# Patient Record
Sex: Female | Born: 1990 | Hispanic: Yes | Marital: Married | Smoking: Former smoker
Health system: Southern US, Community
[De-identification: ages and names within clinical notes are randomized; demographics above are authoritative.]

## PROBLEM LIST (undated history)

## (undated) DIAGNOSIS — F419 Anxiety disorder, unspecified: Secondary | ICD-10-CM

## (undated) DIAGNOSIS — S42309A Unspecified fracture of shaft of humerus, unspecified arm, initial encounter for closed fracture: Secondary | ICD-10-CM

## (undated) DIAGNOSIS — F32A Depression, unspecified: Secondary | ICD-10-CM

## (undated) DIAGNOSIS — K529 Noninfective gastroenteritis and colitis, unspecified: Secondary | ICD-10-CM

## (undated) DIAGNOSIS — K219 Gastro-esophageal reflux disease without esophagitis: Secondary | ICD-10-CM

## (undated) DIAGNOSIS — F329 Major depressive disorder, single episode, unspecified: Secondary | ICD-10-CM

## (undated) HISTORY — DX: Gastro-esophageal reflux disease without esophagitis: K21.9

## (undated) HISTORY — PX: APPENDECTOMY: SHX54

## (undated) HISTORY — DX: Anxiety disorder, unspecified: F41.9

## (undated) HISTORY — DX: Noninfective gastroenteritis and colitis, unspecified: K52.9

## (undated) HISTORY — DX: Unspecified fracture of shaft of humerus, unspecified arm, initial encounter for closed fracture: S42.309A

## (undated) HISTORY — DX: Depression, unspecified: F32.A

---
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
## 2016-02-20 ENCOUNTER — Emergency Department: Payer: Self-pay | Admitting: Anesthesiology

## 2016-02-20 ENCOUNTER — Encounter: Payer: Self-pay | Admitting: Emergency Medicine

## 2016-02-20 ENCOUNTER — Emergency Department: Payer: Self-pay

## 2016-02-20 ENCOUNTER — Observation Stay
Admission: EM | Admit: 2016-02-20 | Discharge: 2016-02-21 | Disposition: A | Discharge status: Home / Self Care | Payer: Self-pay | Attending: Surgery | Admitting: Surgery

## 2016-02-20 ENCOUNTER — Encounter
Admission: EM | Disposition: A | Discharge status: Home / Self Care | Payer: Self-pay | Source: Home / Self Care | Attending: Emergency Medicine

## 2016-02-20 DIAGNOSIS — Z87891 Personal history of nicotine dependence: Secondary | ICD-10-CM | POA: Insufficient documentation

## 2016-02-20 DIAGNOSIS — K353 Acute appendicitis with localized peritonitis: Principal | ICD-10-CM | POA: Insufficient documentation

## 2016-02-20 DIAGNOSIS — K3580 Unspecified acute appendicitis: Secondary | ICD-10-CM | POA: Diagnosis present

## 2016-02-20 HISTORY — PX: LAPAROSCOPIC APPENDECTOMY: SHX408

## 2016-02-20 LAB — URINALYSIS COMPLETE WITH MICROSCOPIC (ARMC ONLY)
BILIRUBIN URINE: NEGATIVE
Bacteria, UA: NONE SEEN
GLUCOSE, UA: NEGATIVE mg/dL
LEUKOCYTES UA: NEGATIVE
NITRITE: NEGATIVE
Protein, ur: NEGATIVE mg/dL
SPECIFIC GRAVITY, URINE: 1.02 (ref 1.005–1.030)
pH: 6 (ref 5.0–8.0)

## 2016-02-20 LAB — COMPREHENSIVE METABOLIC PANEL
ALT: 15 U/L (ref 14–54)
AST: 13 U/L — AB (ref 15–41)
Albumin: 4.2 g/dL (ref 3.5–5.0)
Alkaline Phosphatase: 69 U/L (ref 38–126)
Anion gap: 8 (ref 5–15)
BILIRUBIN TOTAL: 1.3 mg/dL — AB (ref 0.3–1.2)
BUN: 7 mg/dL (ref 6–20)
CALCIUM: 9 mg/dL (ref 8.9–10.3)
CO2: 25 mmol/L (ref 22–32)
CREATININE: 0.51 mg/dL (ref 0.44–1.00)
Chloride: 102 mmol/L (ref 101–111)
Glucose, Bld: 91 mg/dL (ref 65–99)
Potassium: 3.4 mmol/L — ABNORMAL LOW (ref 3.5–5.1)
Sodium: 135 mmol/L (ref 135–145)
TOTAL PROTEIN: 8 g/dL (ref 6.5–8.1)

## 2016-02-20 LAB — CBC
HCT: 40.3 % (ref 35.0–47.0)
Hemoglobin: 13.7 g/dL (ref 12.0–16.0)
MCH: 30.5 pg (ref 26.0–34.0)
MCHC: 34 g/dL (ref 32.0–36.0)
MCV: 89.9 fL (ref 80.0–100.0)
PLATELETS: 291 10*3/uL (ref 150–440)
RBC: 4.48 MIL/uL (ref 3.80–5.20)
RDW: 13.9 % (ref 11.5–14.5)
WBC: 14.4 10*3/uL — AB (ref 3.6–11.0)

## 2016-02-20 LAB — POCT PREGNANCY, URINE: Preg Test, Ur: NEGATIVE

## 2016-02-20 LAB — LIPASE, BLOOD: Lipase: 12 U/L (ref 11–51)

## 2016-02-20 SURGERY — APPENDECTOMY, LAPAROSCOPIC
Anesthesia: General

## 2016-02-20 MED ORDER — LACTATED RINGERS IV SOLN
INTRAVENOUS | Status: DC
  Administered 2016-02-21: 02:00:00 via INTRAVENOUS

## 2016-02-20 MED ORDER — IOPAMIDOL (ISOVUE-300) INJECTION 61%
100.0000 mL | Freq: Once | INTRAVENOUS | Status: AC | PRN
  Administered 2016-02-20: 100 mL via INTRAVENOUS
  Filled 2016-02-20: qty 100

## 2016-02-20 MED ORDER — SODIUM CHLORIDE 0.9 % IV BOLUS (SEPSIS)
1000.0000 mL | Freq: Once | INTRAVENOUS | Status: AC
  Administered 2016-02-20: 1000 mL via INTRAVENOUS

## 2016-02-20 MED ORDER — LIDOCAINE HCL (CARDIAC) 20 MG/ML IV SOLN
INTRAVENOUS | Status: DC | PRN
  Administered 2016-02-20: 80 mg via INTRAVENOUS

## 2016-02-20 MED ORDER — DEXAMETHASONE SODIUM PHOSPHATE 10 MG/ML IJ SOLN
INTRAMUSCULAR | Status: DC | PRN
  Administered 2016-02-20: 4 mg via INTRAVENOUS

## 2016-02-20 MED ORDER — OXYCODONE HCL 5 MG/5ML PO SOLN: 5.0000 mg | Freq: Once | ORAL | Status: DC | PRN

## 2016-02-20 MED ORDER — DEXTROSE 5 % IV SOLN
2.0000 g | Freq: Once | INTRAVENOUS | Status: AC
  Administered 2016-02-20: 2 g via INTRAVENOUS
  Filled 2016-02-20: qty 2

## 2016-02-20 MED ORDER — DIATRIZOATE MEGLUMINE & SODIUM 66-10 % PO SOLN
15.0000 mL | Freq: Once | ORAL | Status: AC
  Administered 2016-02-20: 15 mL via ORAL

## 2016-02-20 MED ORDER — PROPOFOL 10 MG/ML IV BOLUS
INTRAVENOUS | Status: DC | PRN
  Administered 2016-02-20: 150 mg via INTRAVENOUS

## 2016-02-20 MED ORDER — FENTANYL CITRATE (PF) 100 MCG/2ML IJ SOLN
INTRAMUSCULAR | Status: DC | PRN
  Administered 2016-02-20 (×2): 50 ug via INTRAVENOUS

## 2016-02-20 MED ORDER — OXYCODONE HCL 5 MG PO TABS: 5.0000 mg | ORAL_TABLET | Freq: Once | ORAL | Status: DC | PRN

## 2016-02-20 MED ORDER — FENTANYL CITRATE (PF) 100 MCG/2ML IJ SOLN
INTRAMUSCULAR | Status: AC
  Filled 2016-02-20: qty 2

## 2016-02-20 MED ORDER — SUCCINYLCHOLINE CHLORIDE 20 MG/ML IJ SOLN
INTRAMUSCULAR | Status: DC | PRN
  Administered 2016-02-20: 80 mg via INTRAVENOUS

## 2016-02-20 MED ORDER — MIDAZOLAM HCL 2 MG/2ML IJ SOLN
INTRAMUSCULAR | Status: DC | PRN
Start: 2016-02-20 — End: 2016-02-20
  Administered 2016-02-20: 2 mg via INTRAVENOUS

## 2016-02-20 MED ORDER — HYDROCODONE-ACETAMINOPHEN 5-325 MG PO TABS
1.0000 | ORAL_TABLET | ORAL | Status: DC | PRN
Start: 2016-02-20 — End: 2016-02-21
  Administered 2016-02-21: 2 via ORAL
  Filled 2016-02-20: qty 2

## 2016-02-20 MED ORDER — BUPIVACAINE-EPINEPHRINE (PF) 0.25% -1:200000 IJ SOLN
INTRAMUSCULAR | Status: DC | PRN
  Administered 2016-02-20: 30 mL via PERINEURAL

## 2016-02-20 MED ORDER — HEPARIN SODIUM (PORCINE) 5000 UNIT/ML IJ SOLN
5000.0000 [IU] | Freq: Three times a day (TID) | INTRAMUSCULAR | Status: DC
  Administered 2016-02-20: 5000 [IU] via SUBCUTANEOUS
  Filled 2016-02-20 (×2): qty 1

## 2016-02-20 MED ORDER — ONDANSETRON HCL 4 MG/2ML IJ SOLN
4.0000 mg | Freq: Four times a day (QID) | INTRAMUSCULAR | Status: DC | PRN
  Administered 2016-02-21: 4 mg via INTRAVENOUS
  Filled 2016-02-20: qty 2

## 2016-02-20 MED ORDER — ONDANSETRON HCL 4 MG/2ML IJ SOLN
INTRAMUSCULAR | Status: DC | PRN
  Administered 2016-02-20: 4 mg via INTRAVENOUS

## 2016-02-20 MED ORDER — HEPARIN SODIUM (PORCINE) 5000 UNIT/ML IJ SOLN: 5000.0000 [IU] | Freq: Three times a day (TID) | INTRAMUSCULAR | Status: DC

## 2016-02-20 MED ORDER — ONDANSETRON HCL 4 MG/2ML IJ SOLN
INTRAMUSCULAR | Status: AC
  Administered 2016-02-20: 4 mg
  Filled 2016-02-20: qty 2

## 2016-02-20 MED ORDER — LACTATED RINGERS IV SOLN
INTRAVENOUS | Status: DC | PRN
  Administered 2016-02-20: 22:00:00 via INTRAVENOUS

## 2016-02-20 MED ORDER — ONDANSETRON HCL 4 MG PO TABS: 4.0000 mg | ORAL_TABLET | Freq: Four times a day (QID) | ORAL | Status: DC | PRN

## 2016-02-20 MED ORDER — FENTANYL CITRATE (PF) 100 MCG/2ML IJ SOLN
25.0000 ug | INTRAMUSCULAR | Status: DC | PRN
Start: 2016-02-20 — End: 2016-02-21
  Administered 2016-02-20 (×4): 25 ug via INTRAVENOUS

## 2016-02-20 MED ORDER — MORPHINE SULFATE (PF) 2 MG/ML IV SOLN
2.0000 mg | INTRAVENOUS | Status: DC | PRN
  Administered 2016-02-21 (×2): 2 mg via INTRAVENOUS
  Filled 2016-02-20 (×2): qty 1

## 2016-02-20 MED ORDER — HYDROCODONE-ACETAMINOPHEN 5-325 MG PO TABS: 1.0000 | ORAL_TABLET | ORAL | Status: DC | PRN

## 2016-02-20 SURGICAL SUPPLY — 41 items
ADHESIVE MASTISOL STRL (MISCELLANEOUS) ×2 IMPLANT
APPLIER CLIP ROT 10 11.4 M/L (STAPLE) ×2
BLADE SURG SZ11 CARB STEEL (BLADE) ×2 IMPLANT
CANISTER SUCT 3000ML (MISCELLANEOUS) ×2 IMPLANT
CATH TRAY 16F METER LATEX (MISCELLANEOUS) ×2 IMPLANT
CHLORAPREP W/TINT 26ML (MISCELLANEOUS) ×2 IMPLANT
CLIP APPLIE ROT 10 11.4 M/L (STAPLE) ×1 IMPLANT
CUTTER FLEX LINEAR 45M (STAPLE) ×2 IMPLANT
DEVICE TROCAR PUNCTURE CLOSURE (ENDOMECHANICALS) ×2 IMPLANT
ELECT REM PT RETURN 9FT ADLT (ELECTROSURGICAL) ×2
ELECTRODE REM PT RTRN 9FT ADLT (ELECTROSURGICAL) ×1 IMPLANT
ENDOPOUCH RETRIEVER 10 (MISCELLANEOUS) ×2 IMPLANT
GAUZE SPONGE NON-WVN 2X2 STRL (MISCELLANEOUS) ×3 IMPLANT
GLOVE BIO SURGEON STRL SZ8 (GLOVE) ×8 IMPLANT
GOWN STRL REUS W/ TWL LRG LVL3 (GOWN DISPOSABLE) ×2 IMPLANT
GOWN STRL REUS W/TWL LRG LVL3 (GOWN DISPOSABLE) ×2
IRRIGATION STRYKERFLOW (MISCELLANEOUS) ×1 IMPLANT
IRRIGATOR STRYKERFLOW (MISCELLANEOUS) ×2
KIT RM TURNOVER STRD PROC AR (KITS) ×2 IMPLANT
LABEL OR SOLS (LABEL) IMPLANT
NDL SAFETY 22GX1.5 (NEEDLE) ×2 IMPLANT
NEEDLE VERESS 14GA 120MM (NEEDLE) ×2 IMPLANT
NS IRRIG 500ML POUR BTL (IV SOLUTION) ×2 IMPLANT
PACK LAP CHOLECYSTECTOMY (MISCELLANEOUS) ×2 IMPLANT
RELOAD 45 VASCULAR/THIN (ENDOMECHANICALS) ×2 IMPLANT
RELOAD STAPLE TA45 3.5 REG BLU (ENDOMECHANICALS) ×2 IMPLANT
SCISSORS METZENBAUM CVD 33 (INSTRUMENTS) ×2 IMPLANT
SLEEVE ENDOPATH XCEL 5M (ENDOMECHANICALS) ×2 IMPLANT
SOL .9 NS 3000ML IRR  AL (IV SOLUTION) ×1
SOL .9 NS 3000ML IRR UROMATIC (IV SOLUTION) ×1 IMPLANT
SPONGE LAP 18X18 5 PK (GAUZE/BANDAGES/DRESSINGS) ×2 IMPLANT
SPONGE VERSALON 2X2 STRL (MISCELLANEOUS) ×3
STRIP CLOSURE SKIN 1/2X4 (GAUZE/BANDAGES/DRESSINGS) ×2 IMPLANT
SUT MNCRL 4-0 (SUTURE) ×1
SUT MNCRL 4-0 27XMFL (SUTURE) ×1
SUT VICRYL 0 TIES 12 18 (SUTURE) ×2 IMPLANT
SUTURE MNCRL 4-0 27XMF (SUTURE) ×1 IMPLANT
TROCAR XCEL 12X100 BLDLESS (ENDOMECHANICALS) ×2 IMPLANT
TROCAR XCEL NON-BLD 5MMX100MML (ENDOMECHANICALS) ×2 IMPLANT
TUBING INSUFFLATOR HI FLOW (MISCELLANEOUS) ×2 IMPLANT
TUBING SCD EXTENSION 9995 GYN (TUBING) ×2 IMPLANT

## 2016-02-20 NOTE — ED Notes (Signed)
Patient transported to CT 

## 2016-02-20 NOTE — Anesthesia Preprocedure Evaluation (Signed)
Anesthesia Evaluation  Patient identified by MRN, date of birth, ID band Patient awake    Reviewed: Allergy & Precautions, H&P , NPO status , Patient's Chart, lab work & pertinent test results  Airway Mallampati: III  TM Distance: >3 FB Neck ROM: full    Dental  (+) Poor Dentition, Chipped   Pulmonary neg shortness of breath, former smoker,    Pulmonary exam normal breath sounds clear to auscultation       Cardiovascular Exercise Tolerance: Good (-) angina(-) Past MI and (-) DOE negative cardio ROS Normal cardiovascular exam Rhythm:regular Rate:Normal     Neuro/Psych negative neurological ROS  negative psych ROS   GI/Hepatic negative GI ROS, Neg liver ROS, neg GERD  ,  Endo/Other  negative endocrine ROS  Renal/GU negative Renal ROS  negative genitourinary   Musculoskeletal   Abdominal   Peds  Hematology negative hematology ROS (+)   Anesthesia Other Findings History reviewed. No pertinent past medical history.  History reviewed. No pertinent surgical history.  BMI    Body Mass Index   28.70 kg/m 2      Reproductive/Obstetrics negative OB ROS                             Anesthesia Physical Anesthesia Plan  ASA: II  Anesthesia Plan: General ETT   Post-op Pain Management:    Induction:   Airway Management Planned:   Additional Equipment:   Intra-op Plan:   Post-operative Plan:   Informed Consent: I have reviewed the patients History and Physical, chart, labs and discussed the procedure including the risks, benefits and alternatives for the proposed anesthesia with the patient or authorized representative who has indicated his/her understanding and acceptance.   Dental Advisory Given  Plan Discussed with: Anesthesiologist, CRNA and Surgeon  Anesthesia Plan Comments:         Anesthesia Quick Evaluation

## 2016-02-20 NOTE — Op Note (Signed)
laparascopic appendectomy   [place] Date of operation:  02/20/2016  Indications: The patient presented with a history of  abdominal pain. Workup has revealed findings consistent with acute appendicitis.  Pre-operative Diagnosis: Acute appendicitis  Post-operative Diagnosis: Acute appendicitis nonruptured  Surgeon: Jerrol Banana. Burt Knack, MD, FACS  Anesthesia: General with endotracheal tube  Procedure Details  The patient was seen again in the preop area. The options of surgery versus observation were reviewed with the patient and/or family. The risks of bleeding, infection, recurrence of symptoms, negative laparoscopy, potential for an open procedure, bowel injury, abscess or infection, were all reviewed as well. The patient was taken to Operating Room, identified as Alexandra Fernandez and the procedure verified as laparoscopic appendectomy. A Time Out was held and the above information confirmed.  The patient was placed in the supine position and general anesthesia was induced.  Antibiotic prophylaxis was administered and VT E prophylaxis was in place. A Foley catheter was placed by the nursing staff.   The abdomen was prepped and draped in a sterile fashion. An infraumbilical incision was made. A Veress needle was placed and pneumoperitoneum was obtained. A 5 mm trocar port was placed without difficulty and the abdominal cavity was explored.  Under direct vision a 5 mm suprapubic port was placed and a 13 mm left lateral port was placed all under direct vision.  The appendix was identified and found to be acutely inflamed . The appendix was carefully dissected. The base of the appendix was dissected out and divided with a standard load Endo GIA. The mesoappendix was divided with a vascular load Endo GIA. There was brisk arterial bleeding from the staple line and this was reinforced with clips which controlled the hemorrhage. The appendix was passed out through the left lateral port  site with the aid of an Endo Catch bag. The right lower quadrant and pelvis was then irrigated with copious amounts of normal saline which was aspirated. Inspection  failed to identify any additional bleeding and there were no signs of bowel injury. Therefore the left lateral port site was closed under direct vision utilizing an Endo Close technique with 0 Vicryl interrupted sutures, all under direct vision.   Again the right lower quadrant was inspected there was no sign of bleeding or bowel injury therefore pneumoperitoneum was released, all ports were removed and the skin incisions were approximated with subcuticular 4-0 Monocryl. Steri-Strips and Mastisol and sterile dressings were placed.  The patient tolerated the procedure well, there were no complications. The sponge lap and needle count were correct at the end of the procedure.  The patient was taken to the recovery room in stable condition to be admitted for continued care.  Findings: Acute appendicitis nonruptured  Estimated Blood Loss: 25 cc                  Specimens: appendix         Complications:  None                  [name] E. Burt Knack MD, FACS

## 2016-02-20 NOTE — Progress Notes (Signed)
Via an interpreter in the preop holding area I reviewed and discussed the options of observation versus surgery and the rationale for offering surgery I also discussed risk bleeding infection and conversion to an open procedure her English is well enough to where she understood even without the interpreter but her family members experienced the translator's expertise. Questions were answered and she agreed to proceed

## 2016-02-20 NOTE — Transfer of Care (Signed)
Immediate Anesthesia Transfer of Care Note  Patient: Alexandra Fernandez  Procedure(s) Performed: Procedure(s): APPENDECTOMY LAPAROSCOPIC (N/A)  Patient Location: PACU  Anesthesia Type:General  Level of Consciousness: awake, oriented and patient cooperative  Airway & Oxygen Therapy: Patient Spontanous Breathing  Post-op Assessment: Report given to RN and Post -op Vital signs reviewed and stable  Post vital signs: Reviewed and stable  Last Vitals:  Filed Vitals:   02/20/16 1838  BP: 107/59  Pulse: 89  Temp: 36.8 C  Resp: 18    Last Pain:  Filed Vitals:   02/20/16 1840  PainSc: 5          Complications: No apparent anesthesia complications

## 2016-02-20 NOTE — H&P (Signed)
Alexandra Fernandez is an 25 y.o. female.    Chief Complaint: Right lower quadrant pain  HPI: This Spanish-speaking patient who speaks enough English to give a history properly. A an interpreter is been requested and has not yet available but I'm able to obtain adequate answers and she understands our discussion.  She describes right lower quadrant pain in fact both lower quadrants starting early this morning she was fine yesterday and is never had an episode like this before she's had some nausea and vomiting normal bowel movement  A workup including a CT scan was relayed to me and is been personally reviewed suggesting acute appendicitis  History reviewed. No pertinent past medical history.  History reviewed. No pertinent past surgical history.  No family history on file. Social History:  reports that she has quit smoking. She does not have any smokeless tobacco history on file. Her alcohol and drug histories are not on file.  Allergies: No Known Allergies   (Not in a hospital admission)   Review of Systems  Constitutional: Negative for fever and chills.  HENT: Negative.   Eyes: Negative.   Respiratory: Negative.   Cardiovascular: Negative.   Gastrointestinal: Positive for nausea, vomiting and abdominal pain. Negative for heartburn and diarrhea.  Genitourinary: Negative.   Musculoskeletal: Negative.   Skin: Negative.   Neurological: Negative.   Endo/Heme/Allergies: Negative.   Psychiatric/Behavioral: Negative.      Physical Exam:  BP 107/59 mmHg  Pulse 89  Temp(Src) 98.3 F (36.8 C) (Oral)  Resp 18  Ht 5' 3" (1.6 m)  Wt 162 lb (73.483 kg)  BMI 28.70 kg/m2  SpO2 100%  LMP 02/01/2016 (Exact Date)  Physical Exam  Constitutional: She is oriented to person, place, and time and well-developed, well-nourished, and in no distress. No distress.  Appears comfortable in no acute distress  HENT:  Head: Normocephalic and atraumatic.  Eyes: Pupils are equal, round,  and reactive to light. Right eye exhibits no discharge. Left eye exhibits no discharge. No scleral icterus.  Neck: Normal range of motion.  Cardiovascular: Normal rate, regular rhythm and normal heart sounds.   Pulmonary/Chest: Effort normal and breath sounds normal. No respiratory distress. She has no wheezes. She has no rales.  Abdominal: Soft. She exhibits no distension. There is tenderness. There is rebound and guarding.  Maximal tenderness in the right lower quadrant at McBurney's point with a positive Rovsing sign  Musculoskeletal: Normal range of motion. She exhibits no edema.  Lymphadenopathy:    She has no cervical adenopathy.  Neurological: She is alert and oriented to person, place, and time.  Skin: Skin is warm and dry. No rash noted. She is not diaphoretic. No erythema.  Psychiatric: Mood and affect normal.  Vitals reviewed.       Results for orders placed or performed during the hospital encounter of 02/20/16 (from the past 48 hour(s))  Lipase, blood     Status: None   Collection Time: 02/20/16  6:43 PM  Result Value Ref Range   Lipase 12 11 - 51 U/L  Comprehensive metabolic panel     Status: Abnormal   Collection Time: 02/20/16  6:43 PM  Result Value Ref Range   Sodium 135 135 - 145 mmol/L   Potassium 3.4 (L) 3.5 - 5.1 mmol/L   Chloride 102 101 - 111 mmol/L   CO2 25 22 - 32 mmol/L   Glucose, Bld 91 65 - 99 mg/dL   BUN 7 6 - 20 mg/dL   Creatinine, Ser   0.51 0.44 - 1.00 mg/dL   Calcium 9.0 8.9 - 10.3 mg/dL   Total Protein 8.0 6.5 - 8.1 g/dL   Albumin 4.2 3.5 - 5.0 g/dL   AST 13 (L) 15 - 41 U/L   ALT 15 14 - 54 U/L   Alkaline Phosphatase 69 38 - 126 U/L   Total Bilirubin 1.3 (H) 0.3 - 1.2 mg/dL   GFR calc non Af Amer >60 >60 mL/min   GFR calc Af Amer >60 >60 mL/min    Comment: (NOTE) The eGFR has been calculated using the CKD EPI equation. This calculation has not been validated in all clinical situations. eGFR's persistently <60 mL/min signify possible  Chronic Kidney Disease.    Anion gap 8 5 - 15  CBC     Status: Abnormal   Collection Time: 02/20/16  6:43 PM  Result Value Ref Range   WBC 14.4 (H) 3.6 - 11.0 K/uL   RBC 4.48 3.80 - 5.20 MIL/uL   Hemoglobin 13.7 12.0 - 16.0 g/dL   HCT 40.3 35.0 - 47.0 %   MCV 89.9 80.0 - 100.0 fL   MCH 30.5 26.0 - 34.0 pg   MCHC 34.0 32.0 - 36.0 g/dL   RDW 13.9 11.5 - 14.5 %   Platelets 291 150 - 440 K/uL  Urinalysis complete, with microscopic     Status: Abnormal   Collection Time: 02/20/16  6:44 PM  Result Value Ref Range   Color, Urine YELLOW (A) YELLOW   APPearance CLEAR (A) CLEAR   Glucose, UA NEGATIVE NEGATIVE mg/dL   Bilirubin Urine NEGATIVE NEGATIVE   Ketones, ur 1+ (A) NEGATIVE mg/dL   Specific Gravity, Urine 1.020 1.005 - 1.030   Hgb urine dipstick 1+ (A) NEGATIVE   pH 6.0 5.0 - 8.0   Protein, ur NEGATIVE NEGATIVE mg/dL   Nitrite NEGATIVE NEGATIVE   Leukocytes, UA NEGATIVE NEGATIVE   RBC / HPF 0-5 0 - 5 RBC/hpf   WBC, UA 0-5 0 - 5 WBC/hpf   Bacteria, UA NONE SEEN NONE SEEN   Squamous Epithelial / LPF 6-30 (A) NONE SEEN   Mucous PRESENT   Pregnancy, urine POC     Status: None   Collection Time: 02/20/16  6:52 PM  Result Value Ref Range   Preg Test, Ur NEGATIVE NEGATIVE    Comment:        THE SENSITIVITY OF THIS METHODOLOGY IS >24 mIU/mL    Ct Abdomen Pelvis W Contrast  02/20/2016  CLINICAL DATA:  Acute right lower quadrant pain with nausea, vomiting and fever. Elevated white count. EXAM: CT ABDOMEN AND PELVIS WITH CONTRAST TECHNIQUE: Multidetector CT imaging of the abdomen and pelvis was performed using the standard protocol following bolus administration of intravenous contrast. CONTRAST:  122m ISOVUE-300 IOPAMIDOL (ISOVUE-300) INJECTION 61% COMPARISON:  None. FINDINGS: Lower chest:  No acute findings. Hepatobiliary: No masses or other significant abnormality. Pancreas: No mass, inflammatory changes, or other significant abnormality. Spleen: Within normal limits in size and  appearance. Adrenals/Urinary Tract: Normal adrenal glands. No renal obstruction or hydronephrosis. No obstructing ureteral calculus on either side. Right kidney demonstrates a 10 mm cortical cyst image 32 in the lower pole. Left kidney demonstrates a upper pole punctate nonobstructing caliceal calculus, image 27. Stomach/Bowel: Negative for bowel obstruction, significant dilatation, ileus, or free air. Appendix is visualized with a thickened edematous wall and mucosal enhancement. Appendix diameter is 12 mm as it extends over the left iliac vessels into the pelvis, image 64. Appearance is compatible with early  non ruptured acute appendicitis. No abscess or fluid collection. Vascular/Lymphatic: Small mildly enlarged right lower quadrant mesenteric lymph nodes noted, images 50 through 53. Intact aorta. No acute vascular process. Reproductive: Uterus and adnexal normal in size. Several ovarian follicles present bilaterally. No pelvic free fluid. Other: No inguinal abnormality or hernia.  Intact abdominal wall. Musculoskeletal:  No acute osseous finding. IMPRESSION: Abnormal thick-walled, edematous and dilated appendix measuring 12 mm compatible with acute early nonruptured appendicitis. These results were called by telephone at the time of interpretation on 02/20/2016 at 9:06 pm to Dr. [name] , who verbally acknowledged these results. Electronically Signed   By: Jerilynn Mages.  Shick M.D.   On: 02/20/2016 21:08     Assessment/Plan  Impression is acute appendicitis I have recommended laparoscopic appendectomy I discussed with she and her family the rationale for surgery the options of observation the risks of bleeding infection and recurrent disease she understood and agreed to proceed she is Spanish-speaking but understands enough English to voice understanding. I did tell her that we would review all of this once an interpreter was available as there was none available in the emergency room at the time of our visit.  Attestation will be performed at that time.  Florene Glen, MD, FACS

## 2016-02-20 NOTE — ED Provider Notes (Signed)
[facility] Emergency Department Provider Note    ____________________________________________  Time seen: ~2005  I have reviewed the triage vital signs and the nursing notes.   HISTORY  Chief Complaint Abdominal Pain   History limited by: Not Limited   HPI Alexandra Fernandez is a 25 y.o. female who presents to the emergency department today because of concerns for abdominal pain. The pain woke her up at 2 AM this morning. At that time the pain was diffusely throughout her abdomen. She did feel like she had a fever as well. She took some Tylenol which she states helped with the fever but did not help with the pain. Since then the pain is migrated into the right side and right lower quadrant. She has not had an appetite today. She has vomited a couple times. She denies any diarrhea or bloody stool. Denies similar pain in the past.   History reviewed. No pertinent past medical history.  There are no active problems to display for this patient.   History reviewed. No pertinent past surgical history.  No current outpatient prescriptions on file.  Allergies Review of patient's allergies indicates no known allergies.  No family history on file.  Social History Social History  Substance Use Topics  . Smoking status: Former Research scientist (life sciences)  . Smokeless tobacco: None  . Alcohol Use: None    Review of Systems  Constitutional: Negative for fever. Cardiovascular: Negative for chest pain. Respiratory: Negative for shortness of breath. Gastrointestinal: Positive for abdominal pain, nausea and vomiting Genitourinary: Negative for dysuria. Musculoskeletal: Negative for back pain. Skin: Negative for rash. Neurological: Negative for headaches, focal weakness or numbness.  10-point ROS otherwise negative.  ____________________________________________   PHYSICAL EXAM:  VITAL SIGNS: ED Triage Vitals  Enc Vitals Group     BP 02/20/16 1838 107/59 mmHg   Pulse Rate 02/20/16 1838 89     Resp 02/20/16 1838 18     Temp 02/20/16 1838 98.3 F (36.8 C)     Temp Source 02/20/16 1838 Oral     SpO2 02/20/16 1838 100 %     Weight 02/20/16 1838 162 lb (73.483 kg)     Height 02/20/16 1838 5\' 3"  (1.6 m)     Head Cir --      Peak Flow --      Pain Score 02/20/16 1839 5   Constitutional: Alert and oriented. Well appearing and in no distress. Eyes: Conjunctivae are normal. PERRL. Normal extraocular movements. ENT   Head: Normocephalic and atraumatic.   Nose: No congestion/rhinnorhea.   Mouth/Throat: Mucous membranes are moist.   Neck: No stridor. Hematological/Lymphatic/Immunilogical: No cervical lymphadenopathy. Cardiovascular: Normal rate, regular rhythm.  No murmurs, rubs, or gallops. Respiratory: Normal respiratory effort without tachypnea nor retractions. Breath sounds are clear and equal bilaterally. No wheezes/rales/rhonchi. Gastrointestinal: Soft. Tender to palpation in the right lower quadrant without any rebound or guarding. Negative Rovsing's sign. No distention. Genitourinary: Deferred Musculoskeletal: Normal range of motion in all extremities. No joint effusions.  No lower extremity tenderness nor edema. Neurologic:  Normal speech and language. No gross focal neurologic deficits are appreciated.  Skin:  Skin is warm, dry and intact. No rash noted. Psychiatric: Mood and affect are normal. Speech and behavior are normal. Patient exhibits appropriate insight and judgment.  ____________________________________________    LABS (pertinent positives/negatives)  Labs Reviewed  COMPREHENSIVE METABOLIC PANEL - Abnormal; Notable for the following:    Potassium 3.4 (*)    AST 13 (*)    Total Bilirubin  1.3 (*)    All other components within normal limits  CBC - Abnormal; Notable for the following:    WBC 14.4 (*)    All other components within normal limits  URINALYSIS COMPLETEWITH MICROSCOPIC ([facility] ONLY) - Abnormal; Notable  for the following:    Color, Urine YELLOW (*)    APPearance CLEAR (*)    Ketones, ur 1+ (*)    Hgb urine dipstick 1+ (*)    Squamous Epithelial / LPF 6-30 (*)    All other components within normal limits  LIPASE, BLOOD  POC URINE PREG, ED  POCT PREGNANCY, URINE     ____________________________________________   EKG  None  ____________________________________________    RADIOLOGY  CT abd/pel  IMPRESSION: Abnormal thick-walled, edematous and dilated appendix measuring 12 mm compatible with acute early nonruptured appendicitis.  I, Alexandra Fernandez, personally discussed these images and results by phone with the on-call radiologist and used this discussion as part of my medical decision making.   ____________________________________________   PROCEDURES  Procedure(s) performed: None  Critical Care performed: No  ____________________________________________   INITIAL IMPRESSION / ASSESSMENT AND PLAN / ED COURSE  Pertinent labs & imaging results that were available during my care of the patient were reviewed by me and considered in my medical decision making (see chart for details).  Patient presented to the emergency department today with concerns for abdominal pain. On exam she is tender in the right lower quadrant and does have leukocytosis on blood work. I do have concern for appendicitis. Will get a CT scan.   CT scan was consistent with acute appendicitis. Dr. Burt Knack with surgery was consulted and he will evaluate the patient. ____________________________________________   FINAL CLINICAL IMPRESSION(S) / ED DIAGNOSES  Final diagnoses:  Acute appendicitis, unspecified acute appendicitis type     Note: This dictation was prepared with Dragon dictation. Any transcriptional errors that result from this process are unintentional    Alexandra Pear, MD 02/20/16 2131

## 2016-02-20 NOTE — Discharge Instructions (Signed)
Remove dressing in 24 hours. °May shower in 24 hours. °Leave paper strips in place. °Resume all home medications. °Follow-up with Dr. Marysa Wessner in 10 days. °

## 2016-02-20 NOTE — ED Notes (Signed)
Patient to ER from [facility] for c/o abdominal pain. Patient reports pain to RLQ and mid abdomen since 0200 this am. +Nausea, vomiting x2. Denies diarrhea. Believes she had fever at 0200, took Tylenol and fever resolved.

## 2016-02-20 NOTE — Anesthesia Procedure Notes (Signed)
Procedure Name: Intubation Date/Time: 02/20/2016 10:24 PM Performed by: Lendon Colonel Pre-anesthesia Checklist: Emergency Drugs available, Patient identified, Suction available, Patient being monitored and Timeout performed Patient Re-evaluated:Patient Re-evaluated prior to inductionOxygen Delivery Method: Circle system utilized Preoxygenation: Pre-oxygenation with 100% oxygen Intubation Type: IV induction and Rapid sequence Laryngoscope Size: Miller and 2 Grade View: Grade I Tube type: Oral Number of attempts: 1 Airway Equipment and Method: Stylet Placement Confirmation: ETT inserted through vocal cords under direct vision,  positive ETCO2 and breath sounds checked- equal and bilateral Secured at: 18 cm Tube secured with: Tape Dental Injury: Teeth and Oropharynx as per pre-operative assessment

## 2016-02-21 ENCOUNTER — Encounter: Payer: Self-pay | Admitting: Surgery

## 2016-02-21 NOTE — Care Management (Signed)
Patient admitted status post lap appy.  Patient provided with application for [facility] and Medication Management.  Vicodin is the only rx at time of discharge.  Coupon provided for patient from [facility]. At [facility] per patient request.  Out of pocket cost. $12.30

## 2016-02-21 NOTE — Anesthesia Postprocedure Evaluation (Signed)
Anesthesia Post Note  Patient: Alexandra Fernandez  Procedure(s) Performed: Procedure(s) (LRB): APPENDECTOMY LAPAROSCOPIC (N/A)  Patient location during evaluation: PACU Anesthesia Type: General Level of consciousness: awake and alert Pain management: pain level controlled Vital Signs Assessment: post-procedure vital signs reviewed and stable Respiratory status: spontaneous breathing, nonlabored ventilation, respiratory function stable and patient connected to nasal cannula oxygen Cardiovascular status: blood pressure returned to baseline and stable Postop Assessment: no signs of nausea or vomiting Anesthetic complications: no    Last Vitals:  Filed Vitals:   02/21/16 0148 02/21/16 0535  BP: 117/66 101/60  Pulse: 86 96  Temp: 36.6 C 36.7 C  Resp: 18 20    Last Pain:  Filed Vitals:   02/21/16 0611  PainSc: 8                  Precious Haws Piscitello

## 2016-02-21 NOTE — Progress Notes (Signed)
02/21/2016 14:00  Alexandra Fernandez to be D/C'd Home per MD order.  Discussed prescriptions and follow up appointments with the patient. Prescriptions given to patient, medication list explained in detail. Pt verbalized understanding.    Medication List    TAKE these medications        HYDROcodone-acetaminophen 5-325 MG tablet  Commonly known as:  NORCO/VICODIN  Take 1-2 tablets by mouth every 4 (four) hours as needed for moderate pain.        Filed Vitals:   02/21/16 0535 02/21/16 0928  BP: 101/60 114/67  Pulse: 96 91  Temp: 98.1 F (36.7 C) 98 F (36.7 C)  Resp: 20 18    Skin clean, dry and intact without evidence of skin break down, no evidence of skin tears noted. IV catheter discontinued intact. Site without signs and symptoms of complications. Dressing and pressure applied. Pt denies pain at this time. No complaints noted.  An After Visit Summary was printed and given to the patient. Patient escorted via [place], and D/C home via private auto.  Dola Argyle

## 2016-02-21 NOTE — Discharge Summary (Signed)
  Patient ID: Monna Shiels MRN: [id number] DOB/AGE: 1990-09-28 25 y.o.  Admit date: 02/20/2016 Discharge date: 02/21/2016   Discharge Diagnoses:  Active Problems:   Acute appendicitis   Procedures:lap appendectomy  Hospital Course: 25 year old female admitted with classic signs and symptoms of acute appendicitis confirmed by CT scan. Dr. Burt Knack perform a laparoscopic appendectomy and this was uneventful. She was kept overnight. At time of discharge she was ambulating, she was tolerating regular diet she was afebrile and her vital signs were stable. Her physical exam she was in no acute distress awake alert, abdomen was soft incisions with dressings in place no evidence of infection And with significant clinical improvement. Condition at the time of discharge is stable   Disposition: Final discharge disposition not confirmed  Discharge Instructions    Call MD for:  difficulty breathing, headache or visual disturbances    Complete by:  As directed      Call MD for:  extreme fatigue    Complete by:  As directed      Call MD for:  hives    Complete by:  As directed      Call MD for:  persistant dizziness or light-headedness    Complete by:  As directed      Call MD for:  persistant nausea and vomiting    Complete by:  As directed      Call MD for:  redness, tenderness, or signs of infection (pain, swelling, redness, odor or green/yellow discharge around incision site)    Complete by:  As directed      Call MD for:  severe uncontrolled pain    Complete by:  As directed      Call MD for:  temperature >100.4    Complete by:  As directed      Diet - low sodium heart healthy    Complete by:  As directed      Increase activity slowly    Complete by:  As directed      Lifting restrictions    Complete by:  As directed   20 lbs            Medication List    TAKE these medications        HYDROcodone-acetaminophen 5-325 MG tablet  Commonly known as:  NORCO/VICODIN  Take  1-2 tablets by mouth every 4 (four) hours as needed for moderate pain.           Follow-up Information    Follow up with Phoebe Perch, MD On 02/29/2016.   Specialty:  Surgery   Why:  @9 :30am  For wound re-check   Contact information:   [address] [place] [address] [phone]        Caroleen Hamman, MD FACS

## 2016-02-22 LAB — SURGICAL PATHOLOGY

## 2016-02-26 ENCOUNTER — Encounter: Payer: Self-pay | Admitting: Surgery

## 2016-02-27 ENCOUNTER — Telehealth: Payer: Self-pay

## 2016-02-27 NOTE — Telephone Encounter (Signed)
Patient's FMLA was filled out and faxed to [employer]. Fax # [phone] Phone# [phone]

## 2016-02-29 ENCOUNTER — Ambulatory Visit (INDEPENDENT_AMBULATORY_CARE_PROVIDER_SITE_OTHER): Payer: Self-pay | Admitting: Surgery

## 2016-02-29 ENCOUNTER — Ambulatory Visit: Payer: Self-pay | Admitting: Surgery

## 2016-02-29 VITALS — BP 116/68 | HR 59 | Temp 99.0°F | Ht 63.0 in | Wt 161.0 lb

## 2016-02-29 DIAGNOSIS — K3580 Unspecified acute appendicitis: Secondary | ICD-10-CM

## 2016-02-29 MED ORDER — HYDROCODONE-ACETAMINOPHEN 5-300 MG PO TABS: 1.0000 | ORAL_TABLET | ORAL | 0 refills | Status: DC | PRN

## 2016-02-29 MED ORDER — HYDROCODONE-ACETAMINOPHEN 10-325 MG PO TABS: 1.0000 | ORAL_TABLET | Freq: Four times a day (QID) | ORAL | 0 refills | Status: DC | PRN

## 2016-02-29 NOTE — Progress Notes (Signed)
Outpatient postop visit  02/29/2016  Alexandra Fernandez is an 25 y.o. female.    Procedure: Lap or scopic appendectomy  [id number]  HPI: feels well but has left lower quadrant incisional pain he is concerned about going to work around [place] taking narcotics. But she asked for refill of her Vicodin. Eating well.  Medications reviewed.    Physical Exam:  LMP 02/01/2016 (Exact Date) Comment: neg preg test    PE: Abdomen is soft nondistended nontympanitic and nontender wounds are clean without erythema or drainage    Assessment/Plan:  Pathology is reviewed. Patient doing well but having pain around her 13 mm incision in the left lower quadrant as expected. I will refill her Vicodin and asked her to follow up on an as-needed basis she can return to work in 2 weeks  Florene Glen, MD, FACS

## 2016-02-29 NOTE — Patient Instructions (Signed)
Please call our office if you have any questions.

## 2016-03-04 NOTE — Telephone Encounter (Signed)
error 

## 2016-03-06 ENCOUNTER — Telehealth: Payer: Self-pay | Admitting: Surgery

## 2016-03-06 NOTE — Telephone Encounter (Signed)
Starting this morning at 8:00 AM patient stated that she was woken up with a sharp and pulsating pain on her Lower Right Abdomen. She stated that the pain last a good five minutes before it diminished. Then she went to take a shower and her soap had fallen off her hand and when she went bent down to grab it, the sharp pain started again and last about five minutes again. She then took a Hydrocodone that she had left from her surgery that was done on 02/20/2016. Four hours later, patient took a Motrin to ease her pain but neither the Hydrocodone nor the Motrin has helped with the pain. Since her second episode, the pain has not gone away. She stated that this was the first time that this had happened. Patient also stated that now its a constant burning sensation on her largest incision area, warm at touch but patient stated that she does not have a fever. Patient denies nausea, vomiting, constipation, or diarrhea. I told patient that I would have to call her after I speak with the doctor on how we need to handle her case. I also told her that as soon as the doctor tells me what to do, then I will call her back. Patient understood. I asked patient to give me another number:[phone].

## 2016-03-06 NOTE — Telephone Encounter (Signed)
Patient is calling again requesting to speak with a nurse. She is in a lot of pain. States the medication she was given is not helping with her pain. She has laparoscopic appendectomy on 7/18 with Dr Burt Knack. Please call and advise.

## 2016-03-06 NOTE — Telephone Encounter (Signed)
Patient had her appendix removed 7/18 and the incision is fine but she states she is having pain on the inside. She would rather speak Spanish so if Herb Grays could please call her.

## 2016-03-06 NOTE — Telephone Encounter (Signed)
Returned patients call and was not able to leave her a voicemail since it's not set-up. Will call her later on.

## 2016-03-07 ENCOUNTER — Encounter: Payer: Self-pay | Admitting: Surgery

## 2016-03-07 ENCOUNTER — Ambulatory Visit (INDEPENDENT_AMBULATORY_CARE_PROVIDER_SITE_OTHER): Payer: Self-pay | Admitting: Surgery

## 2016-03-07 VITALS — BP 108/63 | HR 62 | Temp 98.3°F | Ht 63.0 in | Wt 161.6 lb

## 2016-03-07 DIAGNOSIS — K353 Acute appendicitis with localized peritonitis, without perforation or gangrene: Secondary | ICD-10-CM

## 2016-03-07 MED ORDER — CYCLOBENZAPRINE HCL 5 MG PO TABS: 5.0000 mg | ORAL_TABLET | Freq: Three times a day (TID) | ORAL | Status: DC | PRN

## 2016-03-07 NOTE — Patient Instructions (Signed)
Please call our office if you have any questions or concerns.  

## 2016-03-07 NOTE — Telephone Encounter (Signed)
Called patient back and told her that we will need to see her today at 2:30 PM. Patient agreed.

## 2016-03-07 NOTE — Progress Notes (Signed)
25yr old female status post laparoscopic appendectomy on July 18 with Dr. Burt Knack. The patient called in with complaints in her left lower quadrant nodes a stabbing incision pain. The patient states that she has been eating and drinking well and having bowel movements without any issue. Patient also states that the pain that she's been having just stays right at that incision site. The patient states that sometimes that the burning pain but the stabbing bad pain happens usually after picking up and something from the floor or when she first gets up and sits up in the morning.   Vitals:   03/07/16 1446  BP: 108/63  Pulse: 62  Temp: 98.3 F (36.8 C)   PE:  Gen: NAD Abd: soft, appropriately tender, incisions c/d/i healing well  A/P:  Patient seems to be healing well after laparoscopic appendectomy. The patient seems to be having normal pain and some muscle spasms around her left lower quadrant incision especially with sudden movements. This with the patient this is normal. The patient has no evidence of infection and no evidence of herniation in this area. Discussed that these are likely normal postoperative symptoms. I did discuss with the patient that she were to get fever chills nausea or vomiting that she should call our office. I did give her a prescription for some Flexeril to help with the muscle spasms

## 2016-03-08 ENCOUNTER — Encounter: Payer: Self-pay | Admitting: Surgery

## 2017-02-25 ENCOUNTER — Ambulatory Visit: Payer: BLUE CROSS/BLUE SHIELD | Admitting: Obstetrics and Gynecology

## 2017-02-25 VITALS — BP 107/61 | HR 65 | Ht 65.0 in | Wt 175.9 lb

## 2017-02-25 DIAGNOSIS — N926 Irregular menstruation, unspecified: Secondary | ICD-10-CM

## 2017-02-25 NOTE — Progress Notes (Signed)
Alexandra Fernandez presents for NOB nurse interview visit. Pregnancy confirmation done here at [facility].  G- 1.  P- . Pregnancy education material explained and given. _No cats in the home. NOB labs ordered.Marland Kitchen HIV labs and Drug screen were explained optional and she did not decline. Drug screen ordered PNV encouraged. Genetic screening options discussed. Genetic testing:/Unsure.  Pt may discuss with provider. Pt. To follow up with provider in _3_ weeks for NOB physical.  All questions answered.

## 2017-02-26 LAB — CBC WITH DIFFERENTIAL/PLATELET
Basophils Absolute: 0 10*3/uL (ref 0.0–0.2)
Basos: 0 %
EOS (ABSOLUTE): 0.1 10*3/uL (ref 0.0–0.4)
Eos: 1 %
Hematocrit: 38.5 % (ref 34.0–46.6)
Hemoglobin: 12.5 g/dL (ref 11.1–15.9)
Immature Grans (Abs): 0 10*3/uL (ref 0.0–0.1)
Immature Granulocytes: 0 %
Lymphocytes Absolute: 2.2 10*3/uL (ref 0.7–3.1)
Lymphs: 30 %
MCH: 29.6 pg (ref 26.6–33.0)
MCHC: 32.5 g/dL (ref 31.5–35.7)
MCV: 91 fL (ref 79–97)
Monocytes Absolute: 0.5 10*3/uL (ref 0.1–0.9)
Monocytes: 7 %
Neutrophils Absolute: 4.6 10*3/uL (ref 1.4–7.0)
Neutrophils: 62 %
Platelets: 334 10*3/uL (ref 150–379)
RBC: 4.23 x10E6/uL (ref 3.77–5.28)
RDW: 14.6 % (ref 12.3–15.4)
WBC: 7.4 10*3/uL (ref 3.4–10.8)

## 2017-02-26 LAB — RUBELLA SCREEN: RUBELLA: 9.71 {index} (ref >0.99)

## 2017-02-26 LAB — RPR: RPR Ser Ql: NONREACTIVE

## 2017-02-26 LAB — VARICELLA ZOSTER ANTIBODY, IGG: VARICELLA: 1183 {index} (ref >165)

## 2017-02-26 LAB — ANTIBODY SCREEN: ANTIBODY SCREEN: NEGATIVE

## 2017-02-26 LAB — HEPATITIS B SURFACE ANTIGEN: HEP B S AG: NEGATIVE

## 2017-02-26 LAB — ABO AND RH: Rh Factor: POSITIVE

## 2017-02-26 LAB — HIV ANTIBODY (ROUTINE TESTING W REFLEX): HIV Screen 4th Generation wRfx: NONREACTIVE

## 2017-02-27 LAB — URINE CULTURE

## 2017-02-28 LAB — MONITOR DRUG PROFILE 14(MW)
Amphetamine Scrn, Ur: NEGATIVE ng/mL
BARBITURATE SCREEN URINE: NEGATIVE ng/mL
BENZODIAZEPINE SCREEN, URINE: NEGATIVE ng/mL
BUPRENORPHINE, URINE: NEGATIVE ng/mL
CANNABINOIDS UR QL SCN: NEGATIVE ng/mL
CREATININE(CRT), U: 85.6 mg/dL (ref 20.0–300.0)
Cocaine (Metab) Scrn, Ur: NEGATIVE ng/mL
FENTANYL, URINE: NEGATIVE pg/mL
METHADONE SCREEN, URINE: NEGATIVE ng/mL
Meperidine Screen, Urine: NEGATIVE ng/mL
OPIATE SCREEN URINE: NEGATIVE ng/mL
OXYCODONE+OXYMORPHONE UR QL SCN: NEGATIVE ng/mL
PHENCYCLIDINE QUANTITATIVE URINE: NEGATIVE ng/mL
Ph of Urine: 7.6 (ref 4.5–8.9)
Propoxyphene Scrn, Ur: NEGATIVE ng/mL
SPECIFIC GRAVITY: 1.012
TRAMADOL SCREEN, URINE: NEGATIVE ng/mL

## 2017-02-28 LAB — URINALYSIS, ROUTINE W REFLEX MICROSCOPIC
Bilirubin, UA: NEGATIVE
GLUCOSE, UA: NEGATIVE
KETONES UA: NEGATIVE
LEUKOCYTES UA: NEGATIVE
Nitrite, UA: NEGATIVE
PROTEIN UA: NEGATIVE
RBC, UA: NEGATIVE
SPEC GRAV UA: 1.014 (ref 1.005–1.030)
Urobilinogen, Ur: 0.2 mg/dL (ref 0.2–1.0)
pH, UA: 8 — ABNORMAL HIGH (ref 5.0–7.5)

## 2017-02-28 LAB — GC/CHLAMYDIA PROBE AMP
Chlamydia trachomatis, NAA: NEGATIVE
NEISSERIA GONORRHOEAE BY PCR: NEGATIVE

## 2017-03-11 ENCOUNTER — Encounter: Payer: Self-pay | Admitting: *Deleted

## 2017-03-11 ENCOUNTER — Emergency Department
Admission: EM | Admit: 2017-03-11 | Discharge: 2017-03-12 | Disposition: A | Discharge status: Home / Self Care | Payer: BLUE CROSS/BLUE SHIELD | Attending: Emergency Medicine | Admitting: Emergency Medicine

## 2017-03-11 ENCOUNTER — Emergency Department: Payer: BLUE CROSS/BLUE SHIELD

## 2017-03-11 DIAGNOSIS — Z3A08 8 weeks gestation of pregnancy: Secondary | ICD-10-CM | POA: Insufficient documentation

## 2017-03-11 DIAGNOSIS — O209 Hemorrhage in early pregnancy, unspecified: Secondary | ICD-10-CM | POA: Diagnosis not present

## 2017-03-11 DIAGNOSIS — Z79899 Other long term (current) drug therapy: Secondary | ICD-10-CM | POA: Diagnosis not present

## 2017-03-11 DIAGNOSIS — R1030 Lower abdominal pain, unspecified: Secondary | ICD-10-CM | POA: Diagnosis present

## 2017-03-11 LAB — COMPREHENSIVE METABOLIC PANEL
ALK PHOS: 54 U/L (ref 38–126)
ALT: 27 U/L (ref 14–54)
ANION GAP: 8 (ref 5–15)
AST: 21 U/L (ref 15–41)
Albumin: 3.8 g/dL (ref 3.5–5.0)
BUN: 9 mg/dL (ref 6–20)
CALCIUM: 9.2 mg/dL (ref 8.9–10.3)
CO2: 23 mmol/L (ref 22–32)
Chloride: 103 mmol/L (ref 101–111)
Creatinine, Ser: 0.7 mg/dL (ref 0.44–1.00)
GFR calc non Af Amer: >60 mL/min (ref >60)
GLUCOSE: 152 mg/dL — AB (ref 65–99)
POTASSIUM: 3.2 mmol/L — AB (ref 3.5–5.1)
SODIUM: 134 mmol/L — AB (ref 135–145)
TOTAL PROTEIN: 7.1 g/dL (ref 6.5–8.1)
Total Bilirubin: 0.4 mg/dL (ref 0.3–1.2)

## 2017-03-11 LAB — CBC
HEMATOCRIT: 37.2 % (ref 35.0–47.0)
HEMOGLOBIN: 12.5 g/dL (ref 12.0–16.0)
MCH: 30 pg (ref 26.0–34.0)
MCHC: 33.5 g/dL (ref 32.0–36.0)
MCV: 89.4 fL (ref 80.0–100.0)
Platelets: 316 10*3/uL (ref 150–440)
RBC: 4.16 MIL/uL (ref 3.80–5.20)
RDW: 14 % (ref 11.5–14.5)
WBC: 11.5 10*3/uL — ABNORMAL HIGH (ref 3.6–11.0)

## 2017-03-11 LAB — URINALYSIS, COMPLETE (UACMP) WITH MICROSCOPIC
BACTERIA UA: NONE SEEN
BILIRUBIN URINE: NEGATIVE
Glucose, UA: 50 mg/dL — AB
Ketones, ur: 5 mg/dL — AB
Leukocytes, UA: NEGATIVE
NITRITE: NEGATIVE
Protein, ur: NEGATIVE mg/dL
Specific Gravity, Urine: 1.008 (ref 1.005–1.030)
pH: 6 (ref 5.0–8.0)

## 2017-03-11 LAB — POCT PREGNANCY, URINE: PREG TEST UR: POSITIVE — AB

## 2017-03-11 LAB — ABO/RH: ABO/RH(D): O POS

## 2017-03-11 LAB — LIPASE, BLOOD: Lipase: 24 U/L (ref 11–51)

## 2017-03-11 LAB — HCG, QUANTITATIVE, PREGNANCY: HCG, BETA CHAIN, QUANT, S: 126738 m[IU]/mL — AB (ref <5)

## 2017-03-11 NOTE — ED Notes (Signed)
poct pregnancy Positive

## 2017-03-11 NOTE — ED Notes (Signed)
Patient transported to Ultrasound 

## 2017-03-11 NOTE — ED Notes (Signed)
Pt back from US

## 2017-03-11 NOTE — ED Triage Notes (Signed)
Pt is pregnant approx 10 weeks.  Pt has abd pain with small amount of bleeding.  No dysuria.  Pt has low back pain .

## 2017-03-11 NOTE — ED Provider Notes (Signed)
[facility] Emergency Department Provider Note   First MD Initiated Contact with Patient 03/11/17 2300     (approximate)  I have reviewed the triage vital signs and the nursing notes.   HISTORY  Chief Complaint Abdominal Pain and Back Pain    HPI Alexandra Fernandez is a 26 y.o. female G1 P0 "[redacted] weeks pregnant" presents to the emergency department with suprapubic discomfort with "small amount of bleeding" with onset today. Patient denies any dysuria no fever. Patient denies any nausea or vomiting. Patient states her current pain score is 5 out of 10.   Past Medical History None There are no active problems to display for this patient.   Past Surgical History:  Procedure Laterality Date  . APPENDECTOMY    . LAPAROSCOPIC APPENDECTOMY N/A 02/20/2016   Procedure: APPENDECTOMY LAPAROSCOPIC;  Surgeon: Florene Glen, MD;  Location: [facility] ORS;  Service: General;  Laterality: N/A;    Prior to Admission medications   Medication Sig Start Date End Date Taking? Authorizing Provider  Prenatal Vit-Fe Fumarate-FA (PRENATAL MULTIVITAMIN) TABS tablet Take 1 tablet by mouth daily at 12 noon.   Yes [provider]    Allergies No Known Drug Allergies  Family History  Problem Relation Age of Onset  . Alcohol abuse Neg Hx   . Arthritis Neg Hx   . Asthma Neg Hx   . Cancer Neg Hx   . Birth defects Neg Hx   . COPD Neg Hx   . Depression Neg Hx   . Diabetes Neg Hx   . Drug abuse Neg Hx   . Early death Neg Hx   . Hearing loss Neg Hx   . Heart disease Neg Hx   . Hyperlipidemia Neg Hx   . Hypertension Neg Hx   . Kidney disease Neg Hx   . Learning disabilities Neg Hx   . Mental illness Neg Hx   . Mental retardation Neg Hx   . Miscarriages / Stillbirths Neg Hx   . Stroke Neg Hx   . Vision loss Neg Hx   . Varicose Veins Neg Hx     Social History Social History  Substance Use Topics  . Smoking status: Never Smoker  . Smokeless tobacco: Never  Used  . Alcohol use No     Comment: prior to pregnancy    Review of Systems Constitutional: No fever/chills Eyes: No visual changes. ENT: No sore throat. Cardiovascular: Denies chest pain. Respiratory: Denies shortness of breath. Gastrointestinal: No abdominal pain.  No nausea, no vomiting.  No diarrhea.  No constipation. Genitourinary: Negative for dysuria.Positive for vaginal bleeding Musculoskeletal: Negative for neck pain.  Negative for back pain. Integumentary: Negative for rash. Neurological: Negative for headaches, focal weakness or numbness.   ____________________________________________   PHYSICAL EXAM:  VITAL SIGNS: ED Triage Vitals  Enc Vitals Group     BP 03/11/17 2054 115/70     Pulse Rate 03/11/17 2054 79     Resp 03/11/17 2054 20     Temp 03/11/17 2052 (P) 98.5 F (36.9 C)     Temp Source 03/11/17 2052 (P) Oral     SpO2 03/11/17 2054 100 %     Weight 03/11/17 2054 78.5 kg (173 lb)     Height 03/11/17 2054 1.651 m (5\' 5" )     Head Circumference --      Peak Flow --      Pain Score 03/11/17 2053 8     Pain Loc --  Pain Edu? --      Excl. in [place]? --     Constitutional: Alert and oriented. Well appearing and in no acute distress. Eyes: Conjunctivae are normal. Head: Atraumatic. Mouth/Throat: Mucous membranes are moist. Oropharynx non-erythematous. Neck: No stridor.   Cardiovascular: Normal rate, regular rhythm. Good peripheral circulation. Grossly normal heart sounds. Respiratory: Normal respiratory effort.  No retractions. Lungs CTAB. Gastrointestinal: Soft and nontender. No distention.  Genitourinary: Unremarkable vaginal exam, no bleeding from the cervical os. No blood noted in the vagina. Musculoskeletal: No lower extremity tenderness nor edema. No gross deformities of extremities. Neurologic:  Normal speech and language. No gross focal neurologic deficits are appreciated.  Skin:  Skin is warm, dry and intact. No rash noted. Psychiatric: Mood  and affect are normal. Speech and behavior are normal.  ____________________________________________   LABS (all labs ordered are listed, but only abnormal results are displayed)  Labs Reviewed  COMPREHENSIVE METABOLIC PANEL - Abnormal; Notable for the following:       Result Value   Sodium 134 (*)    Potassium 3.2 (*)    Glucose, Bld 152 (*)    All other components within normal limits  CBC - Abnormal; Notable for the following:    WBC 11.5 (*)    All other components within normal limits  URINALYSIS, COMPLETE (UACMP) WITH MICROSCOPIC - Abnormal; Notable for the following:    Color, Urine STRAW (*)    APPearance CLEAR (*)    Glucose, UA 50 (*)    Hgb urine dipstick SMALL (*)    Ketones, ur 5 (*)    Squamous Epithelial / LPF 0-5 (*)    All other components within normal limits  HCG, QUANTITATIVE, PREGNANCY - Abnormal; Notable for the following:    hCG, Beta Chain, Quant, [place] 126,738 (*)    All other components within normal limits  POCT PREGNANCY, URINE - Abnormal; Notable for the following:    Preg Test, Ur POSITIVE (*)    All other components within normal limits  LIPASE, BLOOD  POC URINE PREG, ED  ABO/RH   _____  RADIOLOGY I, [place] Ceferino Lang, personally viewed and evaluated these images (plain radiographs) as part of my medical decision making, as well as reviewing the written report by the radiologist.  US Ob Comp Less 14 Wks  Result Date: 03/12/2017 CLINICAL DATA:  Spotting x1 day with midline pelvic cramping. EXAM: OBSTETRIC <14 WK [place] AND TRANSVAGINAL OB US TECHNIQUE: Both transabdominal and transvaginal ultrasound examinations were performed for complete evaluation of the gestation as well as the maternal uterus, adnexal regions, and pelvic cul-de-sac. Transvaginal technique was performed to assess early pregnancy. COMPARISON:  None. FINDINGS: Intrauterine gestational sac: Single Yolk sac:  Visualized. Embryo:  Visualized. Cardiac Activity: Visualized. Heart Rate: 171   bpm CRL:  21.4  mm   8 w   5 d                  [place] EDC: 10/16/2017 Subchorionic hemorrhage:  None visualized. Maternal uterus/adnexae: Corpus luteum on the right. Unremarkable appearance of the uterus and left ovary. IMPRESSION: Single live intrauterine gestation at 8 weeks 5 days. Ultrasound [facility] 10/16/2017. Otherwise negative exam. Electronically Signed   By: Ashley Royalty M.D.   On: 03/12/2017 00:19   US Ob Transvaginal  Result Date: 03/12/2017 CLINICAL DATA:  Spotting x1 day with midline pelvic cramping. EXAM: OBSTETRIC <14 WK [place] AND TRANSVAGINAL OB US TECHNIQUE: Both transabdominal and transvaginal ultrasound examinations were performed for complete  evaluation of the gestation as well as the maternal uterus, adnexal regions, and pelvic cul-de-sac. Transvaginal technique was performed to assess early pregnancy. COMPARISON:  None. FINDINGS: Intrauterine gestational sac: Single Yolk sac:  Visualized. Embryo:  Visualized. Cardiac Activity: Visualized. Heart Rate: 171  bpm CRL:  21.4  mm   8 w   5 d                  [place] EDC: 10/16/2017 Subchorionic hemorrhage:  None visualized. Maternal uterus/adnexae: Corpus luteum on the right. Unremarkable appearance of the uterus and left ovary. IMPRESSION: Single live intrauterine gestation at 8 weeks 5 days. Ultrasound [facility] 10/16/2017. Otherwise negative exam. Electronically Signed   By: Ashley Royalty M.D.   On: 03/12/2017 00:19      Procedures   ____________________________________________   INITIAL IMPRESSION / ASSESSMENT AND PLAN / ED COURSE  Pertinent labs & imaging results that were available during my care of the patient were reviewed by me and considered in my medical decision making (see chart for details).  26 year old G1 P0 presenting with pelvic discomfort and vaginal spotting. No blood noted on vaginal exam cervical os closed and no active bleeding. Ultrasound revealed a single live intrauterine pregnancy 8 weeks 5 days. Spoke with patient at length  regarding the necessity of following up with [facility] within 48 hours.      ____________________________________________  FINAL CLINICAL IMPRESSION(S) / ED DIAGNOSES  Final diagnoses:  Vaginal bleeding in pregnancy, first trimester     MEDICATIONS GIVEN DURING THIS VISIT:  Medications - No data to display   NEW OUTPATIENT MEDICATIONS STARTED DURING THIS VISIT:  New Prescriptions   No medications on file    Modified Medications   No medications on file    Discontinued Medications   No medications on file     Note:  This document was prepared using Dragon voice recognition software and may include unintentional dictation errors.    Gregor Hams, MD 03/12/17 [phone]

## 2017-03-13 ENCOUNTER — Ambulatory Visit (INDEPENDENT_AMBULATORY_CARE_PROVIDER_SITE_OTHER): Payer: BLUE CROSS/BLUE SHIELD | Admitting: Obstetrics and Gynecology

## 2017-03-13 ENCOUNTER — Encounter: Payer: Self-pay | Admitting: Obstetrics and Gynecology

## 2017-03-13 VITALS — BP 108/60 | HR 70 | Ht 64.75 in | Wt 173.3 lb

## 2017-03-13 DIAGNOSIS — O200 Threatened abortion: Secondary | ICD-10-CM | POA: Diagnosis not present

## 2017-03-13 NOTE — Progress Notes (Addendum)
HPI:      [name] is a 26 y.o. G1P0 who LMP was Patient's last menstrual period was 12/28/2016.  Subjective:   She presents today After being seen in the emergency department for pelvic cramping and spotting. Her Quant and ultrasound were consistent with a normal intrauterine pregnancy at 8 weeks. Her spotting has resolved as has her pelvic discomfort. Patient does complain of some back pain but has had "back issues" in the past. She is taking prenatal vitamins.    Hx: The following portions of the patient's history were reviewed and updated as appropriate:             She  has no past medical history on file. She  does not have any pertinent problems on file. She  has a past surgical history that includes laparoscopic appendectomy (N/A, 02/20/2016) and Appendectomy. Her family history includes Cancer in her maternal grandmother; Stroke in her paternal grandmother. She  reports that she has never smoked. She has never used smokeless tobacco. She reports that she does not drink alcohol or use drugs. She has No Known Allergies.       Review of Systems:  Review of Systems  Constitutional: Denied constitutional symptoms, night sweats, recent illness, fatigue, fever, insomnia and weight loss.  Eyes: Denied eye symptoms, eye pain, photophobia, vision change and visual disturbance.  Ears/Nose/Throat/Neck: Denied ear, nose, throat or neck symptoms, hearing loss, nasal discharge, sinus congestion and sore throat.  Cardiovascular: Denied cardiovascular symptoms, arrhythmia, chest pain/pressure, edema, exercise intolerance, orthopnea and palpitations.  Respiratory: Denied pulmonary symptoms, asthma, pleuritic pain, productive sputum, cough, dyspnea and wheezing.  Gastrointestinal: Denied, gastro-esophageal reflux, melena, nausea and vomiting.  Genitourinary: Denied genitourinary symptoms including symptomatic vaginal discharge, pelvic relaxation issues, and urinary complaints.   Musculoskeletal: Denied musculoskeletal symptoms, stiffness, swelling, muscle weakness and myalgia.  Dermatologic: Denied dermatology symptoms, rash and scar.  Neurologic: Denied neurology symptoms, dizziness, headache, neck pain and syncope.  Psychiatric: Denied psychiatric symptoms, anxiety and depression.  Endocrine: Denied endocrine symptoms including hot flashes and night sweats.   Meds:   Current Outpatient Prescriptions on File Prior to Visit  Medication Sig Dispense Refill  . Prenatal Vit-Fe Fumarate-FA (PRENATAL MULTIVITAMIN) TABS tablet Take 1 tablet by mouth daily at 12 noon.     Current Facility-Administered Medications on File Prior to Visit  Medication Dose Route Frequency Provider Last Rate Last Dose  . cyclobenzaprine (FLEXERIL) tablet 5 mg  5 mg Oral TID PRN Hubbard Robinson, MD        Objective:     Vitals:   03/13/17 1523  BP: 108/60  Pulse: 70                Assessment:    G1P0 There are no active problems to display for this patient.    1. Threatened abortion     Patient has a normal intrauterine pregnancy and no further bleeding as occurred since her emergency department visit.   Plan:            1.  Follow-up for new OB visit as scheduled with Madyson Lukach.  2. Continue prenatal vitamins  3.  Patient to contact us if her bleeding resumes.  Orders No orders of the defined types were placed in this encounter.   No orders of the defined types were placed in this encounter.       F/U  No Follow-up on file. I spent 20 minutes with this patient of which greater than 50%  was spent discussing her emergency department visit, her pregnancy, the dating of her pregnancy which was inconsistent with her menstrual period dating, what to expect for the remainder of the pregnancy, nurses versus midwives and style of practice.  Finis Bud, M.D. 03/13/2017 4:26 PM

## 2017-03-19 ENCOUNTER — Encounter: Payer: BLUE CROSS/BLUE SHIELD | Admitting: Certified Nurse Midwife

## 2017-03-19 ENCOUNTER — Ambulatory Visit (INDEPENDENT_AMBULATORY_CARE_PROVIDER_SITE_OTHER): Payer: BLUE CROSS/BLUE SHIELD | Admitting: Obstetrics and Gynecology

## 2017-03-19 ENCOUNTER — Encounter: Payer: Self-pay | Admitting: Obstetrics and Gynecology

## 2017-03-19 VITALS — BP 95/58 | HR 84 | Wt 174.3 lb

## 2017-03-19 DIAGNOSIS — Z3491 Encounter for supervision of normal pregnancy, unspecified, first trimester: Secondary | ICD-10-CM

## 2017-03-19 NOTE — Progress Notes (Signed)
NOB:  Patient denies further bleeding. Reports no problems. She is taking her prenatal vitamins. She is here today for her new OB. Physical examination General NAD, Conversant  HEENT Atraumatic; Op clear with mmm.  Normo-cephalic. Pupils reactive. Anicteric sclerae  Thyroid/Neck Smooth without nodularity or enlargement. Normal ROM.  Neck Supple.  Skin No rashes, lesions or ulceration. Normal palpated skin turgor. No nodularity.  Breasts: No masses or discharge.  Symmetric.  No axillary adenopathy.  Lungs: Clear to auscultation.No rales or wheezes. Normal Respiratory effort, no retractions.  Heart: NSR.  No murmurs or rubs appreciated. No periferal edema  Abdomen: Soft.  Non-tender.  No masses.  No HSM. No hernia  Extremities: Moves all appropriately.  Normal ROM for age. No lymphadenopathy.  Neuro: Oriented to PPT.  Normal mood. Normal affect.     Pelvic:   Vulva: Normal appearance.  No lesions.  Vagina: No lesions or abnormalities noted.  Support: Normal pelvic support.  Urethra No masses tenderness or scarring.  Meatus Normal size without lesions or prolapse.  Cervix: Normal appearance.  No lesions.  Anus: Normal exam.  No lesions.  Perineum: Normal exam.  No lesions.        Bimanual   Adnexae: No masses.  Non-tender to palpation.  Uterus: Enlarged. 10 wks  Non-tender.  Mobile.  AV.  Adnexae: No masses.  Non-tender to palpation.  Cul-de-sac: Negative for abnormality.  Adnexae: No masses.  Non-tender to palpation.         Pelvimetry   Diagonal: Reached.  Spines: Average.  Sacrum: Concave.  Pubic Arch: Normal.

## 2017-03-25 ENCOUNTER — Ambulatory Visit: Payer: BLUE CROSS/BLUE SHIELD | Admitting: Obstetrics and Gynecology

## 2017-03-25 VITALS — BP 99/68 | HR 70 | Wt 171.9 lb

## 2017-03-25 DIAGNOSIS — Z3A10 10 weeks gestation of pregnancy: Secondary | ICD-10-CM

## 2017-03-25 NOTE — Progress Notes (Signed)
Alexandra Fernandez presents for NOB nurse interview visit. Pregnancy confirmation done here at [facility] G-1 .  P0 . Pregnancy education material explained and given. No cats in the home. NOB labs ordered. Marland Kitchen HIV labs and Drug screen were explained optional and she did not decline. Drug screen ordered. PNV encouraged. Genetic screening options discussed. Genetic testing will discuss with provider.   Pt. To follow up with provider in _2_ weeks for NOB physical.  All questions answered.

## 2017-03-26 LAB — ABO AND RH: RH TYPE: POSITIVE

## 2017-03-26 LAB — URINALYSIS, ROUTINE W REFLEX MICROSCOPIC
BILIRUBIN UA: NEGATIVE
Glucose, UA: NEGATIVE
KETONES UA: NEGATIVE
Leukocytes, UA: NEGATIVE
Nitrite, UA: NEGATIVE
PH UA: 6.5 (ref 5.0–7.5)
PROTEIN UA: NEGATIVE
RBC UA: NEGATIVE
SPEC GRAV UA: 1.008 (ref 1.005–1.030)
UUROB: 0.2 mg/dL (ref 0.2–1.0)

## 2017-03-26 LAB — CBC WITH DIFFERENTIAL/PLATELET
Basophils Absolute: 0 10*3/uL (ref 0.0–0.2)
Basos: 0 %
EOS (ABSOLUTE): 0.1 10*3/uL (ref 0.0–0.4)
EOS: 1 %
HEMOGLOBIN: 12.4 g/dL (ref 11.1–15.9)
Hematocrit: 37.3 % (ref 34.0–46.6)
IMMATURE GRANS (ABS): 0 10*3/uL (ref 0.0–0.1)
Immature Granulocytes: 0 %
LYMPHS ABS: 2.3 10*3/uL (ref 0.7–3.1)
Lymphs: 28 %
MCH: 30.8 pg (ref 26.6–33.0)
MCHC: 33.2 g/dL (ref 31.5–35.7)
MCV: 93 fL (ref 79–97)
MONOS ABS: 0.4 10*3/uL (ref 0.1–0.9)
Monocytes: 4 %
NEUTROS PCT: 67 %
Neutrophils Absolute: 5.4 10*3/uL (ref 1.4–7.0)
Platelets: 329 10*3/uL (ref 150–379)
RBC: 4.03 x10E6/uL (ref 3.77–5.28)
RDW: 14.1 % (ref 12.3–15.4)
WBC: 8.1 10*3/uL (ref 3.4–10.8)

## 2017-03-26 LAB — MONITOR DRUG PROFILE 14(MW)
AMPHETAMINE SCREEN URINE: NEGATIVE ng/mL
BARBITURATE SCREEN URINE: NEGATIVE ng/mL
BENZODIAZEPINE SCREEN, URINE: NEGATIVE ng/mL
Buprenorphine, Urine: NEGATIVE ng/mL
CANNABINOIDS UR QL SCN: NEGATIVE ng/mL
COCAINE(METAB.)SCREEN, URINE: NEGATIVE ng/mL
Creatinine(Crt), U: 36.6 mg/dL (ref 20.0–300.0)
Fentanyl, Urine: NEGATIVE pg/mL
MEPERIDINE SCREEN, URINE: NEGATIVE ng/mL
Methadone Screen, Urine: NEGATIVE ng/mL
OPIATE SCREEN URINE: NEGATIVE ng/mL
OXYCODONE+OXYMORPHONE UR QL SCN: NEGATIVE ng/mL
Ph of Urine: 6 (ref 4.5–8.9)
Phencyclidine Qn, Ur: NEGATIVE ng/mL
Propoxyphene Scrn, Ur: NEGATIVE ng/mL
SPECIFIC GRAVITY: 1.008
TRAMADOL SCREEN, URINE: NEGATIVE ng/mL

## 2017-03-26 LAB — RPR: RPR: NONREACTIVE

## 2017-03-26 LAB — GC/CHLAMYDIA PROBE AMP
Chlamydia trachomatis, NAA: NEGATIVE
Neisseria gonorrhoeae by PCR: NEGATIVE

## 2017-03-26 LAB — ANTIBODY SCREEN: ANTIBODY SCREEN: NEGATIVE

## 2017-03-26 LAB — RUBELLA SCREEN: Rubella Antibodies, IGG: 10.3 index (ref >0.99)

## 2017-03-26 LAB — HEPATITIS B SURFACE ANTIGEN: Hepatitis B Surface Ag: NEGATIVE

## 2017-03-26 LAB — VARICELLA ZOSTER ANTIBODY, IGG: VARICELLA: 1248 {index} (ref >165)

## 2017-03-26 LAB — HIV ANTIBODY (ROUTINE TESTING W REFLEX): HIV SCREEN 4TH GENERATION: NONREACTIVE

## 2017-03-27 LAB — URINE CULTURE: Organism ID, Bacteria: NO GROWTH

## 2017-03-28 LAB — PAP IG, CT-NG, RFX HPV ASCU
Chlamydia, Nuc. Acid Amp: NEGATIVE
Gonococcus by Nucleic Acid Amp: NEGATIVE
PAP SMEAR COMMENT: 0

## 2017-03-28 LAB — HPV DNA PROBE HIGH RISK, AMPLIFIED: HPV, high-risk: NEGATIVE

## 2017-03-31 LAB — HM PAP SMEAR: HM Pap smear: NEGATIVE

## 2017-04-09 ENCOUNTER — Ambulatory Visit (INDEPENDENT_AMBULATORY_CARE_PROVIDER_SITE_OTHER): Payer: BLUE CROSS/BLUE SHIELD | Admitting: Certified Nurse Midwife

## 2017-04-09 ENCOUNTER — Encounter: Payer: Self-pay | Admitting: Certified Nurse Midwife

## 2017-04-09 VITALS — BP 99/64 | HR 61 | Wt 173.6 lb

## 2017-04-09 DIAGNOSIS — Z3491 Encounter for supervision of normal pregnancy, unspecified, first trimester: Secondary | ICD-10-CM

## 2017-04-09 NOTE — Progress Notes (Signed)
Pt had NOB physical exam by Dr. Amalia Hailey on 03/19/17, discussed pap smear results. Reviewed plan of care throughout pregnancy, discussed midwifery & MD service lines. 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 Reviewed genetic testing options, she declines at this time.  FHR today, she will follow up in 4 wks.  PLAN: New OB counseling: The patient has been given an overview regarding routine prenatal care. Recommendations regarding diet, weight gain, and exercise in pregnancy were given. Prenatal testing, optional genetic testing, and carrier screening. Ultrasound use in pregnancy were reviewed.  Benefits of Breast Feeding were discussed. The patient is encouraged to consider nursing her baby post partum. See orders

## 2017-04-09 NOTE — Patient Instructions (Signed)
Common Medications Safe in Pregnancy  Acne:      Constipation:  Benzoyl Peroxide     Colace  Clindamycin      Dulcolax Suppository  Topica Erythromycin     Fibercon  Salicylic Acid      Metamucil         Miralax AVOID:        Senakot   Accutane    Cough:  Retin-A       Cough Drops  Tetracycline      Phenergan w/ Codeine if Rx  Minocycline      Robitussin (Plain & DM)  Antibiotics:     Crabs/Lice:  Ceclor       RID  Cephalosporins    AVOID:  E-Mycins      Kwell  Keflex  Macrobid/Macrodantin   Diarrhea:  Penicillin      Kao-Pectate  Zithromax      Imodium AD         PUSH FLUIDS AVOID:       Cipro     Fever:  Tetracycline      Tylenol (Regular or Extra  Minocycline       Strength)  Levaquin      Extra Strength-Do not          Exceed 8 tabs/24 hrs Caffeine:        <200mg/day (equiv. To 1 cup of coffee or  approx. 3 12 oz sodas)         Gas: Cold/Hayfever:       Gas-X  Benadryl      Mylicon  Claritin       Phazyme  **Claritin-D        Chlor-Trimeton    Headaches:  Dimetapp      ASA-Free Excedrin  Drixoral-Non-Drowsy     Cold Compress  Mucinex (Guaifenasin)     Tylenol (Regular or Extra  Sudafed/Sudafed-12 Hour     Strength)  **Sudafed PE Pseudoephedrine   Tylenol Cold & Sinus     Vicks Vapor Rub  Zyrtec  **AVOID if Problems With Blood Pressure         Heartburn: Avoid lying down for at least 1 hour after meals  Aciphex      Maalox     Rash:  Milk of Magnesia     Benadryl    Mylanta       1% Hydrocortisone Cream  Pepcid  Pepcid Complete   Sleep Aids:  Prevacid      Ambien   Prilosec       Benadryl  Rolaids       Chamomile Tea  Tums (Limit 4/day)     Unisom  Zantac       Tylenol PM         Warm milk-add vanilla or  Hemorrhoids:       Sugar for taste  Anusol/Anusol H.C.  (RX: Analapram 2.5%)  Sugar Substitutes:  Hydrocortisone OTC     Ok in moderation  Preparation H      Tucks        Vaseline lotion applied to tissue with  wiping    Herpes:     Throat:  Acyclovir      Oragel  Famvir  Valtrex     Vaccines:         Flu Shot Leg Cramps:       *Gardasil  Benadryl      Hepatitis A         Hepatitis B Nasal Spray:         Pneumovax  Saline Nasal Spray     Polio Booster         Tetanus Nausea:       Tuberculosis test or PPD  Vitamin B6 25 mg TID   AVOID:    Dramamine      *Gardasil  Emetrol       Live Poliovirus  Ginger Root 250 mg QID    MMR (measles, mumps &  High Complex Carbs @ Bedtime    rebella)  Sea Bands-Accupressure    Varicella (Chickenpox)  Unisom 1/2 tab TID     *No known complications           If received before Pain:         Known pregnancy;   Darvocet       Resume series after  Lortab        Delivery  Percocet    Yeast:   Tramadol      Femstat  Tylenol 3      Gyne-lotrimin  Ultram       Monistat  Vicodin           MISC:         All Sunscreens           Hair Coloring/highlights          Insect Repellant's          (Including DEET)         [place] Tans Prenatal Care WHAT IS PRENATAL CARE? Prenatal care is the process of caring for a pregnant woman before she gives birth. Prenatal care makes sure that she and her baby remain as healthy as possible throughout pregnancy. Prenatal care may be provided by a midwife, family practice health care provider, or a childbirth and pregnancy specialist (obstetrician). Prenatal care may include physical examinations, testing, treatments, and education on nutrition, lifestyle, and social support services. WHY IS PRENATAL CARE SO IMPORTANT? Early and consistent prenatal care increases the chance that you and your baby will remain healthy throughout your pregnancy. This type of care also decreases a baby's risk of being born too early (prematurely), or being born smaller than expected (small for gestational age). Any underlying medical conditions you may have that could pose a risk during your pregnancy are discussed during prenatal care visits. You will also  be monitored regularly for any new conditions that may arise during your pregnancy so they can be treated quickly and effectively. WHAT HAPPENS DURING PRENATAL CARE VISITS? Prenatal care visits may include the following: Discussion Tell your health care provider about any new signs or symptoms you have experienced since your last visit. These might include:  Nausea or vomiting.  Increased or decreased level of energy.  Difficulty sleeping.  Back or leg pain.  Weight changes.  Frequent urination.  Shortness of breath with physical activity.  Changes in your skin, such as the development of a rash or itchiness.  Vaginal discharge or bleeding.  Feelings of excitement or nervousness.  Changes in your baby's movements.  You may want to write down any questions or topics you want to discuss with your health care provider and bring them with you to your appointment. Examination During your first prenatal care visit, you will likely have a complete physical exam. Your health care provider will often examine your vagina, cervix, and the position of your uterus, as well as check your heart, lungs, and other body systems. As your pregnancy progresses, your health care provider will measure the size of  your uterus and your baby's position inside your uterus. He or she may also examine you for early signs of labor. Your prenatal visits may also include checking your blood pressure and, after about 10-12 weeks of pregnancy, listening to your baby's heartbeat. Testing Regular testing often includes:  Urinalysis. This checks your urine for glucose, protein, or signs of infection.  Blood count. This checks the levels of white and red blood cells in your body.  Tests for sexually transmitted infections (STIs). Testing for STIs at the beginning of pregnancy is routinely done and is required in many states.  Antibody testing. You will be checked to see if you are immune to certain illnesses, such  as rubella, that can affect a developing fetus.  Glucose screen. Around 24-28 weeks of pregnancy, your blood glucose level will be checked for signs of gestational diabetes. Follow-up tests may be recommended.  Group B strep. This is a bacteria that is commonly found inside a woman's vagina. This test will inform your health care provider if you need an antibiotic to reduce the amount of this bacteria in your body prior to labor and childbirth.  Ultrasound. Many pregnant women undergo an ultrasound screening around 18-20 weeks of pregnancy to evaluate the health of the fetus and check for any developmental abnormalities.  HIV (human immunodeficiency virus) testing. Early in your pregnancy, you will be screened for HIV. If you are at high risk for HIV, this test may be repeated during your third trimester of pregnancy.  You may be offered other testing based on your age, personal or family medical history, or other factors. HOW OFTEN SHOULD I PLAN TO SEE MY HEALTH CARE PROVIDER FOR PRENATAL CARE? Your prenatal care check-up schedule depends on any medical conditions you have before, or develop during, your pregnancy. If you do not have any underlying medical conditions, you will likely be seen for checkups:  Monthly, during the first 6 months of pregnancy.  Twice a month during months 7 and 8 of pregnancy.  Weekly starting in the 9th month of pregnancy and until delivery.  If you develop signs of early labor or other concerning signs or symptoms, you may need to see your health care provider more often. Ask your health care provider what prenatal care schedule is best for you. WHAT CAN I DO TO KEEP MYSELF AND MY BABY AS HEALTHY AS POSSIBLE DURING MY PREGNANCY?  Take a prenatal vitamin containing 400 micrograms (0.4 mg) of folic acid every day. Your health care provider may also ask you to take additional vitamins such as iodine, vitamin D, iron, copper, and zinc.  Take 1500-2000 mg of calcium  daily starting at your 20th week of pregnancy until you deliver your baby.  Make sure you are up to date on your vaccinations. Unless directed otherwise by your health care provider: ? You should receive a tetanus, diphtheria, and pertussis (Tdap) vaccination between the 27th and 36th week of your pregnancy, regardless of when your last Tdap immunization occurred. This helps protect your baby from whooping cough (pertussis) after he or she is born. ? You should receive an annual inactivated influenza vaccine (IIV) to help protect you and your baby from influenza. This can be done at any point during your pregnancy.  Eat a well-rounded diet that includes: ? Fresh fruits and vegetables. ? Lean proteins. ? Calcium-rich foods such as milk, yogurt, hard cheeses, and dark, leafy greens. ? Whole grain breads.  Do noteat seafood high in mercury, including: ?  Swordfish. ? Tilefish. ? Shark. ? King mackerel. ? More than 6 oz tuna per week.  Do not eat: ? Raw or undercooked meats or eggs. ? Unpasteurized foods, such as soft cheeses (brie, blue, or feta), juices, and milks. ? Lunch meats. ? Hot dogs that have not been heated until they are steaming.  Drink enough water to keep your urine clear or pale yellow. For many women, this may be 10 or more 8 oz glasses of water each day. Keeping yourself hydrated helps deliver nutrients to your baby and may prevent the start of pre-term uterine contractions.  Do not use any tobacco products including cigarettes, chewing tobacco, or electronic cigarettes. If you need help quitting, ask your health care provider.  Do not drink beverages containing alcohol. No safe level of alcohol consumption during pregnancy has been determined.  Do not use any illegal drugs. These can harm your developing baby or cause a miscarriage.  Ask your health care provider or pharmacist before taking any prescription or over-the-counter medicines, herbs, or supplements.  Limit  your caffeine intake to no more than 200 mg per day.  Exercise. Unless told otherwise by your health care provider, try to get 30 minutes of moderate exercise most days of the week. Do not  do high-impact activities, contact sports, or activities with a high risk of falling, such as horseback riding or downhill skiing.  Get plenty of rest.  Avoid anything that raises your body temperature, such as hot tubs and saunas.  If you own a cat, do not empty its litter box. Bacteria contained in cat feces can cause an infection called toxoplasmosis. This can result in serious harm to the fetus.  Stay away from chemicals such as insecticides, lead, mercury, and cleaning or paint products that contain solvents.  Do not have any X-rays taken unless medically necessary.  Take a childbirth and breastfeeding preparation class. Ask your health care provider if you need a referral or recommendation.  This information is not intended to replace advice given to you by your health care provider. Make sure you discuss any questions you have with your health care provider. Document Released: 07/25/2003 Document Revised: 12/25/2015 Document Reviewed: 10/06/2013 Elsevier Interactive Patient Education  2017 [employer].

## 2017-04-16 ENCOUNTER — Encounter: Payer: BLUE CROSS/BLUE SHIELD | Admitting: Obstetrics and Gynecology

## 2017-05-07 ENCOUNTER — Ambulatory Visit (INDEPENDENT_AMBULATORY_CARE_PROVIDER_SITE_OTHER): Payer: BLUE CROSS/BLUE SHIELD | Admitting: Obstetrics and Gynecology

## 2017-05-07 VITALS — BP 99/54 | HR 76 | Wt 174.8 lb

## 2017-05-07 DIAGNOSIS — Z23 Encounter for immunization: Secondary | ICD-10-CM | POA: Diagnosis not present

## 2017-05-07 DIAGNOSIS — Z3492 Encounter for supervision of normal pregnancy, unspecified, second trimester: Secondary | ICD-10-CM

## 2017-05-07 LAB — POCT URINALYSIS DIPSTICK
BILIRUBIN UA: NEGATIVE
Blood, UA: NEGATIVE
Glucose, UA: NEGATIVE
KETONES UA: NEGATIVE
LEUKOCYTES UA: NEGATIVE
Nitrite, UA: NEGATIVE
PROTEIN UA: NEGATIVE
Spec Grav, UA: 1.01 (ref 1.010–1.025)
Urobilinogen, UA: 0.2 E.U./dL
pH, UA: 6 (ref 5.0–8.0)

## 2017-05-07 NOTE — Progress Notes (Signed)
ROB- pt is doing well, flu vaccine given 

## 2017-05-07 NOTE — Progress Notes (Signed)
ROB- flu vaccine given, feeling better,anatomy scan, works FT in [employer],

## 2017-05-27 ENCOUNTER — Other Ambulatory Visit: Payer: BLUE CROSS/BLUE SHIELD

## 2017-05-29 ENCOUNTER — Encounter: Payer: BLUE CROSS/BLUE SHIELD | Admitting: Certified Nurse Midwife

## 2017-05-30 ENCOUNTER — Ambulatory Visit (INDEPENDENT_AMBULATORY_CARE_PROVIDER_SITE_OTHER): Payer: BLUE CROSS/BLUE SHIELD

## 2017-05-30 DIAGNOSIS — Z3492 Encounter for supervision of normal pregnancy, unspecified, second trimester: Secondary | ICD-10-CM

## 2017-06-02 ENCOUNTER — Ambulatory Visit (INDEPENDENT_AMBULATORY_CARE_PROVIDER_SITE_OTHER): Payer: BLUE CROSS/BLUE SHIELD | Admitting: Certified Nurse Midwife

## 2017-06-02 VITALS — BP 112/71 | HR 92 | Wt 179.7 lb

## 2017-06-02 DIAGNOSIS — Z3492 Encounter for supervision of normal pregnancy, unspecified, second trimester: Secondary | ICD-10-CM

## 2017-06-02 NOTE — Patient Instructions (Signed)

## 2017-06-02 NOTE — Progress Notes (Signed)
ROB-Pt doing well, no questions or concerns. Anatomy scan today, complete and normal. Findings reviewed with pt, verbalized understanding. Anticipatory guidance regarding course of round ligament pain, home treatment measures, and course of prenatal care. Reviewed red flag symptoms and when to call.RTC x 4 weeks for ROB with Deneise Lever or sooner if needed.   ULTRASOUND REPORT  Location: [facility] Date of Service: 05/30/17  Indications:Anatomy Findings:  Alexandra Fernandez intrauterine pregnancy is visualized with FHR at 162 BPM. Biometrics give an (U/S) Gestational age of 44 5/7 weeks and an (U/S) EDD of 10/12/17; this correlates with the clinically established EDD of 10/16/17.  Fetal presentation is vertex.  EFW: 368 grams (0lb 13oz). Placenta: Anterior and grade 1.  Placenta is 8.3cm from cervical os. AFI: WNL subjectively.  Anatomic survey is complete and appears WNL. Gender - Female.   Right Ovary was not visualized. Left Ovary was not visualized. There is no obvious evidence of a corpus luteal cyst. Survey of the adnexa demonstrates no adnexal masses. There is no free peritoneal fluid in the cul de sac.  Impression: 1. 20 5/7 week Viable Singleton Intrauterine pregnancy by U/S. 2. (U/S) EDD is consistent with Clinically established (LMP) EDD of 10/16/17. 3. Normal Anatomy Scan  Recommendations: 1.Clinical correlation with the patient's History and Physical Exam.

## 2017-06-02 NOTE — Progress Notes (Signed)
ROB- pt is doing well 

## 2017-06-30 ENCOUNTER — Ambulatory Visit (INDEPENDENT_AMBULATORY_CARE_PROVIDER_SITE_OTHER): Payer: BLUE CROSS/BLUE SHIELD | Admitting: Certified Nurse Midwife

## 2017-06-30 ENCOUNTER — Encounter: Payer: Self-pay | Admitting: Certified Nurse Midwife

## 2017-06-30 VITALS — BP 102/68 | HR 87 | Wt 186.9 lb

## 2017-06-30 DIAGNOSIS — Z3492 Encounter for supervision of normal pregnancy, unspecified, second trimester: Secondary | ICD-10-CM

## 2017-06-30 DIAGNOSIS — M79601 Pain in right arm: Secondary | ICD-10-CM

## 2017-06-30 LAB — POCT URINALYSIS DIPSTICK
Bilirubin, UA: NEGATIVE
GLUCOSE UA: NEGATIVE
KETONES UA: NEGATIVE
Leukocytes, UA: NEGATIVE
Nitrite, UA: NEGATIVE
Protein, UA: NEGATIVE
SPEC GRAV UA: 1.01 (ref 1.010–1.025)
UROBILINOGEN UA: 0.2 U/dL
pH, UA: 7.5 (ref 5.0–8.0)

## 2017-06-30 NOTE — Progress Notes (Signed)
ROB- Pt is c/o headache all day, has not taken any medication, pt is also concerned about some slight pain and tightness in her upper abdominal area, pt is also concerned pain in her joints in her knees and elbows, shoulders, more so pain in her right arm, no known injury

## 2017-06-30 NOTE — Progress Notes (Signed)
ROB, doing well. Endorses good fetal movement and denies LOF, vag bleeding , and contractions. She complains of right sided arm pain and pain in her hands. She states that it is keeping her from resting . She denies heavy lifting with her works. 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 Discussed repetitive motions and swelling in pregnancy. She states that she is sleeping on her lefts side. Discussed PO hydration , elevation of hands to assist with hand pain. Referral placed for nero. Reviewed Glucose at next visit. She verbalizes understanding and agrees to plan. Follow up in 4 wks.   Philip Aspen, CNM

## 2017-06-30 NOTE — Patient Instructions (Signed)
Glucose Tolerance Test During Pregnancy The glucose tolerance test (GTT) is a blood test used to determine if you have developed a type of diabetes during pregnancy (gestational diabetes). This is when your body does not properly process sugar (glucose) in the food you eat, resulting in high blood glucose levels. Typically, a GTT is done after you have had a 1-hour glucose test with results that indicate you possibly have gestational diabetes. It may also be done if:  You have a history of giving birth to very large babies or have experienced repeated fetal loss (stillbirth).  You have signs and symptoms of diabetes, such as: ? Changes in your vision. ? Tingling or numbness in your hands or feet. ? Changes in hunger, thirst, and urination not otherwise explained by your pregnancy.  The GTT lasts about 3 hours. You will be given a sugar-water solution to drink at the beginning of the test. You will have blood drawn before you drink the solution and then again 1, 2, and 3 hours after you drink it. You will not be allowed to eat or drink anything else during the test. You must remain at the testing location to make sure that your blood is drawn on time. You should also avoid exercising during the test, because exercise can alter test results. How do I prepare for this test? Eat normally for 3 days prior to the GTT test, including having plenty of carbohydrate-rich foods. Do not eat or drink anything except water during the final 12 hours before the test. In addition, your health care provider may ask you to stop taking certain medicines before the test. What do the results mean? It is your responsibility to obtain your test results. Ask the lab or department performing the test when and how you will get your results. Contact your health care provider to discuss any questions you have about your results. Range of Normal Values Ranges for normal values may vary among different labs and hospitals. You  should always check with your health care provider after having lab work or other tests done to discuss whether your values are considered within normal limits. Normal levels of blood glucose are as follows:  Fasting: less than 105 mg/dL.  1 hour after drinking the solution: less than 190 mg/dL.  2 hours after drinking the solution: less than 165 mg/dL.  3 hours after drinking the solution: less than 145 mg/dL.  Some substances can interfere with GTT results. These may include:  Blood pressure and heart failure medicines, including beta blockers, furosemide, and thiazides.  Anti-inflammatory medicines, including aspirin.  Nicotine.  Some psychiatric medicines.  Meaning of Results Outside Normal Value Ranges GTT test results that are above normal values may indicate a number of health problems, such as:  Gestational diabetes.  Acute stress response.  Cushing syndrome.  Tumors such as pheochromocytoma or glucagonoma.  Long-term kidney problems.  Pancreatitis.  Hyperthyroidism.  Current infection.  Discuss your test results with your health care provider. He or she will use the results to make a diagnosis and determine a treatment plan that is right for you. This information is not intended to replace advice given to you by your health care provider. Make sure you discuss any questions you have with your health care provider. Document Released: 01/21/2012 Document Revised: 12/28/2015 Document Reviewed: 11/26/2013 Elsevier Interactive Patient Education  2017 [employer].

## 2017-07-07 ENCOUNTER — Other Ambulatory Visit: Payer: Self-pay | Admitting: Certified Nurse Midwife

## 2017-07-07 MED ORDER — OSELTAMIVIR PHOSPHATE 75 MG PO CAPS: 75.0000 mg | ORAL_CAPSULE | Freq: Two times a day (BID) | ORAL | 0 refills | Status: AC

## 2017-07-07 NOTE — Progress Notes (Signed)
Pt called today for complaints of the flu. She has a fever, body aches, headache, and just feels bad. She also complains of congestions, and runny nose. She denies any recent contact with someone that has the flu. She states that she is feeling fetal movement and denies contractions. Tamiflu ordered and sent to pharmacy on file. Note sent via my chart for work excuse. Red flag symptoms reviewed. Follow up as needed if symptoms get worse or do not improve or as schedule.   Philip Aspen, CNM

## 2017-07-08 ENCOUNTER — Telehealth: Payer: Self-pay | Admitting: Certified Nurse Midwife

## 2017-07-08 ENCOUNTER — Encounter: Payer: Self-pay | Admitting: Certified Nurse Midwife

## 2017-07-08 NOTE — Telephone Encounter (Signed)
The patient called and stated that she would like to speak with Alexandra Fernandez, to get a note for work because she is sick. No other information was disclosed. Please advise.

## 2017-07-08 NOTE — Progress Notes (Unsigned)
Pt called lvm states she is sick today and unable to go to work. Pt is requesting a note for work for today. Please advise pt [phone].

## 2017-07-08 NOTE — Telephone Encounter (Signed)
Patient called requesting a note for work. She states she is still sick and did not go to work today. Thanks

## 2017-07-08 NOTE — Telephone Encounter (Signed)
Request was sent with previous message.

## 2017-07-09 ENCOUNTER — Other Ambulatory Visit: Payer: Self-pay | Admitting: Certified Nurse Midwife

## 2017-07-09 NOTE — Progress Notes (Signed)
PT requesting work note due to having the flu. Note sent via my chart.   Philip Aspen, CNM

## 2017-07-09 NOTE — Telephone Encounter (Signed)
Work note sent via my chart.  Philip Aspen, CNM

## 2017-07-22 ENCOUNTER — Ambulatory Visit (INDEPENDENT_AMBULATORY_CARE_PROVIDER_SITE_OTHER): Payer: BLUE CROSS/BLUE SHIELD | Admitting: Obstetrics and Gynecology

## 2017-07-22 ENCOUNTER — Other Ambulatory Visit: Payer: BLUE CROSS/BLUE SHIELD

## 2017-07-22 VITALS — BP 115/74 | HR 94 | Wt 191.2 lb

## 2017-07-22 DIAGNOSIS — Z131 Encounter for screening for diabetes mellitus: Secondary | ICD-10-CM

## 2017-07-22 DIAGNOSIS — Z3492 Encounter for supervision of normal pregnancy, unspecified, second trimester: Secondary | ICD-10-CM

## 2017-07-22 DIAGNOSIS — Z23 Encounter for immunization: Secondary | ICD-10-CM | POA: Diagnosis not present

## 2017-07-22 DIAGNOSIS — Z130 Encounter for screening for diseases of the blood and blood-forming organs and certain disorders involving the immune mechanism: Secondary | ICD-10-CM

## 2017-07-22 DIAGNOSIS — R7302 Impaired glucose tolerance (oral): Secondary | ICD-10-CM

## 2017-07-22 DIAGNOSIS — R7309 Other abnormal glucose: Secondary | ICD-10-CM

## 2017-07-22 LAB — POCT URINALYSIS DIPSTICK
BILIRUBIN UA: NEGATIVE
KETONES UA: NEGATIVE
Leukocytes, UA: NEGATIVE
Nitrite, UA: NEGATIVE
PH UA: 6 (ref 5.0–8.0)
Protein, UA: NEGATIVE
RBC UA: NEGATIVE
SPEC GRAV UA: 1.015 (ref 1.010–1.025)
UROBILINOGEN UA: 0.2 U/dL

## 2017-07-22 MED ORDER — TETANUS-DIPHTH-ACELL PERTUSSIS 5-2.5-18.5 LF-MCG/0.5 IM SUSP: 0.5000 mL | Freq: Once | INTRAMUSCULAR | Status: DC

## 2017-07-22 NOTE — Progress Notes (Signed)
ROB- glucola done, blood consent signed, tdap given, c/o cough -productive, otherwise she is doing well

## 2017-07-22 NOTE — Patient Instructions (Signed)
Circumcision:Tercer trimestre de Media planner (Third Trimester of Pregnancy) El tercer trimestre comprende desde la [id number] hasta la [id number], es decir, desde el mes7 hasta el mes9. El tercer trimestre es un perodo en el que el feto crece rpidamente. Hacia el final del noveno mes, el feto mide alrededor de 20pulgadas (45cm) de largo y pesa entre 6 y 62 libras (2,700 y 47,500kg). CAMBIOS EN EL ORGANISMO Su organismo atraviesa por muchos cambios durante el [place], y estos varan de Ardelia Mems mujer a Theatre manager.  Seguir [employer]. Es de esperar que aumente entre 25 y 35libras (61 y 16kg) hacia el final del Media planner.  Podrn aparecer las primeras [employer] caderas, el abdomen y las [place].  Puede tener necesidad de Garment/textile technologist con ms frecuencia porque el feto baja hacia la pelvis y ejerce presin sobre la vejiga.  Debido al Glennis Brink podr sentir Victorio Palm estomacal con frecuencia.  Puede estar estreida, ya que ciertas hormonas enlentecen los movimientos de los msculos que [employer] desechos a travs de los intestinos.  Pueden aparecer hemorroides o abultarse e hincharse las venas (venas varicosas).  Puede sentir dolor plvico debido al [employer] y a que las hormonas del Scientist, research (life sciences) las articulaciones entre los huesos de la pelvis. El dolor de espalda puede ser consecuencia de la sobrecarga de los msculos que soportan la [place].  Tal vez haya cambios en el cabello que pueden incluir su engrosamiento, crecimiento rpido y cambios en la textura. Adems, a algunas mujeres se les cae el cabello durante o despus del embarazo, o tienen el cabello seco o fino. Lo ms probable es que el cabello se le normalice despus del nacimiento del beb.  Las [employer] seguirn creciendo y Teaching laboratory technician. A veces, puede haber una secrecin amarilla de las mamas llamada calostro.  El ombligo puede salir hacia afuera.  Puede sentir que le falta el aire debido a que se expande el tero.  Puede notar  que el feto "baja" o lo siente ms bajo, en el abdomen.  Puede tener una prdida de secrecin mucosa con sangre. Esto suele ocurrir en el trmino de unos pocos das a una semana antes de que comience el [place].  El cuello del tero se vuelve delgado y blando (se borra) cerca de la fecha de [place]. QU DEBE ESPERAR EN LOS EXMENES PRENATALES Le harn exmenes prenatales cada 2semanas hasta la semana36. A partir de ese momento le harn exmenes semanales. Durante una visita prenatal de rutina:  La pesarn para asegurarse de que usted y el feto estn creciendo normalmente.  Le tomarn la presin arterial.  Le medirn el abdomen para controlar el desarrollo del beb.  Se escucharn los latidos cardacos fetales.  Se evaluarn los resultados de los estudios solicitados en visitas anteriores.  Le revisarn el cuello del tero cuando est prxima la fecha de parto para controlar si este se ha borrado. Alrededor de la semana36, el mdico le revisar el cuello del tero. Al mismo tiempo, realizar un anlisis de las secreciones del tejido vaginal. Este examen es para determinar si hay un tipo de bacteria, estreptococo Grupo B. El mdico le explicar esto con ms detalle. El mdico puede preguntarle lo siguiente:  Cmo le gustara que fuera el [place].  Cmo se siente.  Si siente los movimientos del beb.  Si ha tenido sntomas anormales, como prdida de lquido, [place], dolores de cabeza intensos o clicos abdominales.  Si est consumiendo algn producto que contenga tabaco, como cigarrillos, tabaco de Higher education careers adviser y  cigarrillos electrnicos.  Si tiene [employer]. Otros exmenes o estudios de deteccin que pueden realizarse durante el tercer trimestre incluyen lo siguiente:  Anlisis de sangre para controlar los niveles de hierro (anemia).  Controles fetales para determinar su salud, nivel de [place] y Mining engineer. Si tiene [place] enfermedad o hay problemas durante el  embarazo, le harn estudios.  Prueba del VIH (virus de inmunodeficiencia humana). Si corre Electronics engineer, pueden realizarle una prueba de deteccin del VIH durante el tercer trimestre del embarazo. FALSO TRABAJO DE PARTO Es posible que sienta contracciones leves e irregulares que finalmente desaparecen. Se llaman contracciones de Braxton Hicks o falso trabajo de [place]. Las Yahoo pueden durar horas, das o incluso semanas, antes de que el verdadero trabajo de parto se inicie. Si las contracciones ocurren a intervalos regulares, se intensifican o se hacen dolorosas, lo mejor es que la revise el mdico. SIGNOS DE TRABAJO DE PARTO  Clicos de tipo menstrual.  Contracciones cada 50minutos o menos.  Contracciones que comienzan en la parte superior del tero y se extienden hacia abajo, a la zona inferior del abdomen y la espalda.  Sensacin de mayor presin en la pelvis o dolor de espalda.  Una secrecin de mucosidad acuosa o con sangre que sale de la vagina. Si tiene alguno de estos signos antes de la [id number] del Media planner, llame a su mdico de inmediato. Debe concurrir al hospital para que la controlen inmediatamente. INSTRUCCIONES PARA EL CUIDADO EN EL HOGAR  Evite fumar, consumir hierbas, beber alcohol y tomar frmacos que no le hayan recetado. Estas sustancias qumicas afectan la formacin y el desarrollo del beb.  No consuma ningn producto que contenga tabaco, lo que incluye cigarrillos, tabaco de Higher education careers adviser y Psychologist, sport and exercise. Si necesita ayuda para dejar de fumar, consulte al [employer]. Puede recibir asesoramiento y otro tipo de recursos para dejar de fumar.  [place] mdico en relacin con el uso de medicamentos. Durante el embarazo, hay medicamentos que son seguros de tomar y otros que no.  Haga ejercicio solamente como se lo haya indicado el mdico. Sentir clicos uterinos es un buen signo para Ambulance person actividad fsica.  Contine comiendo alimentos  sanos con regularidad.  Use un sostn que le brinde buen soporte si le [employer].  No se d baos de inmersin en agua caliente, baos turcos ni saunas.  Use el cinturn de seguridad en todo momento mientras conduce.  No coma carne cruda ni queso sin cocinar; evite el contacto con las bandejas sanitarias de los gatos y la tierra que estos animales usan. Estos elementos contienen grmenes que pueden causar defectos congnitos en el beb.  [place].  Tome entre 1500 y 2000mg  de calcio diariamente comenzando en la [id number] del embarazo [place].  Si est estreida, pruebe un laxante suave (si el mdico lo autoriza). Consuma ms alimentos ricos en fibra, como vegetales y frutas frescos y Psychologist, prison and probation services. Beba gran cantidad de lquido para mantener la orina de tono claro o color amarillo plido.  Dese baos de asiento con agua tibia para Best boy o las molestias causadas por las hemorroides. Use una crema para las hemorroides si el mdico la autoriza.  Si tiene venas varicosas, use medias de descanso. Eleve los pies durante 16minutos, 3 o 4veces por da. Limite el consumo de sal en su dieta.  Evite levantar objetos pesados, use zapatos de tacones bajos y [place].  Descanse con las piernas elevadas si tiene  calambres o dolor de cintura.  Visite a su dentista si no lo ha Quarry manager. Use un cepillo de dientes blando para higienizarse los dientes y psese el hilo dental con suavidad.  Puede seguir [employer], a menos que el mdico le indique lo contrario.  No haga viajes largos excepto que sea absolutamente necesario y solo con la autorizacin del [place] clases prenatales para Development worker, international aid, Psychologist, prison and probation services y hacer preguntas sobre el [place] de parto y [place].  Haga un ensayo de la partida al hospital.  Prepare el bolso que llevar al hospital.  Prepare la habitacin del beb.  Concurra  a todas las visitas prenatales segn las indicaciones de su mdico.  SOLICITE ATENCIN MDICA SI:  No est segura de que est en trabajo de parto o de que ha roto la bolsa de las aguas.  Tiene mareos.  Siente clicos leves, presin en la pelvis o dolor persistente en el abdomen.  Tiene nuseas, vmitos o diarrea persistentes.  Margette Fast secrecin vaginal con mal olor.  Siente dolor al [employer].  SOLICITE ATENCIN MDICA DE INMEDIATO SI:  Tiene fiebre.  Tiene una prdida de lquido por la vagina.  Tiene sangrado o pequeas prdidas vaginales.  Siente dolor intenso o clicos en el abdomen.  Sube o baja de peso rpidamente.  Tiene dificultad para respirar y siente dolor de pecho.  Sbitamente se le hinchan mucho el rostro, las [place], los tobillos, los pies o las piernas.  No ha sentido los movimientos del beb durante Leone Brand.  Siente un dolor de cabeza intenso que no se alivia con medicamentos.  Su visin se modifica.  Esta informacin no tiene Marine scientist el consejo del mdico. Asegrese de hacerle al mdico cualquier pregunta que tenga. Document Released: 05/01/2005 Document Revised: 08/12/2014 Document Reviewed: 09/22/2012 Elsevier Interactive Patient Education  [employer].    When boys are born, they have a piece of skin that covers the end of the penis, called the foreskin. Circumcision is the surgical removal of the foreskin to expose the tip of the penis.  A newborn must be stable and healthy to be circumcised. If a parent decides to have her baby circumcised, the procedure is usually performed in the baby's first few days of life (assuming the procedure will not be taking place during a religious ceremony). To perform the procedure, the doctor places the baby on a special table and cleans the baby's penis and foreskin. A special clamp is attached to the penis, and the foreskin is removed. Finally, ointment and gauze or a plastic ring are placed  over the cut to protect it from rubbing against the diaper.  The procedure is done quickly. The baby may cry during the procedure and for a short while afterward. Local anesthesia can greatly reduce your baby's discomfort. If you decide to have your son circumcised, talk with your child's doctor about anesthesia options.  Making the Decision Circumcision is an elective procedure. It's your choice whether to have your son circumcised. In most cases, there is no medical need for a circumcision. It is not required by law or by hospital policy. Scientific studies show some medical benefits of circumcision, but these benefits are not sufficient enough for the American Academy of Pediatrics (AAP) to recommend that all infant boys be circumcised. The AAP does recommend, however, that parents discuss the benefits and risks of circumcision with their pediatrician and then make an informed decision.  Ask yourself why you may  or may not want your son to be circumcised. Some parents may want their sons circumcised for religious, social, or cultural reasons. Followers of the [place] and Islamic faiths have circumcised their female newborns for centuries. Although many newborn boys in the [place] are circumcised, it is much less common in [place] and other parts of the world. Ask yourself if it matters whether your son looks like other men in the family or his peers.  Whether or not to circumcise your newborn is an important decision. Circumcision could be riskier if done later in a boy's life, so if you have any questions or concerns, talk with your doctor about them during your pregnancy. Then you'll have enough time to make an informed decision.  Benefits and Drawbacks According to the AAP, research suggests that there may be some medical benefits to circumcision. Boys who have been circumcised are at reduced risk for:  Foreskin infections Urinary tract infections Penile cancer Sexually transmitted  diseases Phimosis, a condition in uncircumcised males that makes foreskin retraction impossible Here are some of the reasons parents may decide not to have their baby circumcised:  Surgical risks: As with any surgery, circumcision has some risks. Complications are rare and usually minor. The most common complications are bleeding and infection. Penile damage: Very rarely, the foreskin may be cut too short or too long. Equally unlikely is improper healing from the circumcision. These complications may require another circumcision or -- in extreme cases -- penile reconstruction. Alteration of penile sensitivity: Some people claim that circumcision may lessen the sensitivity of the tip of the penis, decreasing sexual pleasure later in life. However, this hasn't been proven to be true. Fear of pain: Some parents choose not to circumcise their sons because they are worried about the pain the baby may feel. Protection of the tip of the penis: When the foreskin is removed, the tip of the penis may become irritated, causing the urinary opening to become too small. This could lead to urination problems that may need to be surgically corrected.

## 2017-07-22 NOTE — Progress Notes (Signed)
OB- doing well, except getting over chest cold, occasional stress incontinence, Tdap given, discussed classes,

## 2017-07-23 ENCOUNTER — Other Ambulatory Visit: Payer: Self-pay | Admitting: Obstetrics and Gynecology

## 2017-07-23 DIAGNOSIS — R7309 Other abnormal glucose: Secondary | ICD-10-CM

## 2017-07-23 LAB — CBC
HEMOGLOBIN: 11.8 g/dL (ref 11.1–15.9)
Hematocrit: 35.7 % (ref 34.0–46.6)
MCH: 30.3 pg (ref 26.6–33.0)
MCHC: 33.1 g/dL (ref 31.5–35.7)
MCV: 92 fL (ref 79–97)
Platelets: 306 10*3/uL (ref 150–379)
RBC: 3.89 x10E6/uL (ref 3.77–5.28)
RDW: 13.9 % (ref 12.3–15.4)
WBC: 9 10*3/uL (ref 3.4–10.8)

## 2017-07-23 LAB — RPR: RPR: NONREACTIVE

## 2017-07-23 LAB — GLUCOSE, 1 HOUR GESTATIONAL: GESTATIONAL DIABETES SCREEN: 140 mg/dL — AB (ref 65–139)

## 2017-07-28 ENCOUNTER — Other Ambulatory Visit: Payer: BLUE CROSS/BLUE SHIELD

## 2017-07-28 DIAGNOSIS — R7309 Other abnormal glucose: Secondary | ICD-10-CM

## 2017-07-28 NOTE — Addendum Note (Signed)
Addended by: Elouise Munroe on: 07/28/2017 09:09 AM   Modules accepted: Orders

## 2017-07-29 LAB — GESTATIONAL GLUCOSE TOLERANCE
Glucose, Fasting: 79 mg/dL (ref 65–94)
Glucose, GTT - 1 Hour: 173 mg/dL (ref 65–179)
Glucose, GTT - 2 Hour: 154 mg/dL (ref 65–154)
Glucose, GTT - 3 Hour: 130 mg/dL (ref 65–139)

## 2017-08-02 IMAGING — CT CT ABD-PELV W/ CM
2 of 4 series · 15 of 46 positions shown, 17 images · IV contrast (iopamidol)
Comparison: None.

CLINICAL DATA: Acute right lower quadrant pain with nausea,
vomiting and fever. Elevated white count.

EXAM:
CT ABDOMEN AND PELVIS WITH CONTRAST
TECHNIQUE: Multidetector CT imaging of the abdomen and pelvis was performed
using the standard protocol following bolus administration of
intravenous contrast.
CONTRAST:  100mL [id number] IOPAMIDOL ([id number]) INJECTION 61%

[Series 2: routine abd pel with · axial · 0.67mm/px · z∈[-454,-38]mm · 12 of 95 slices shown, 14 images]
[im 8/95  soft-tissue]
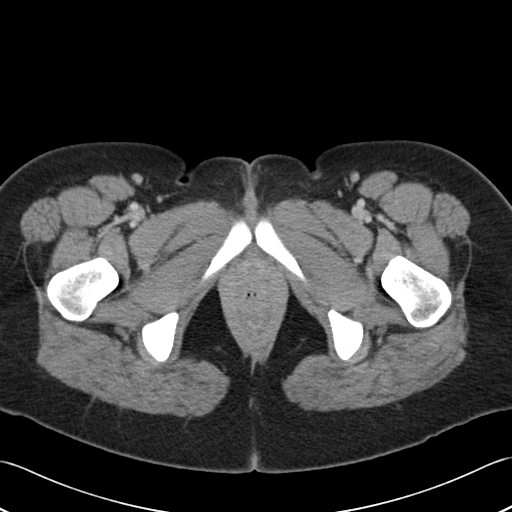
[im 8/95  bone]
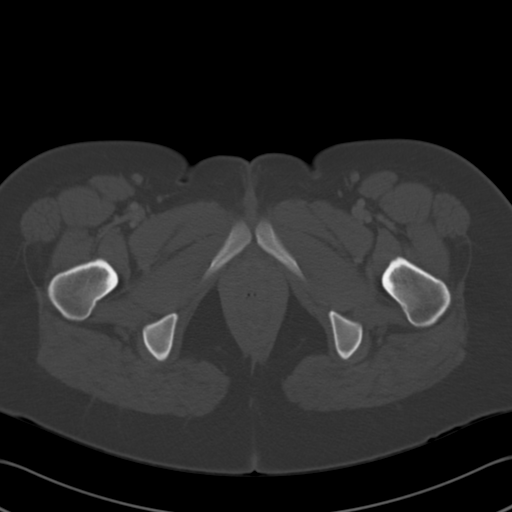
[im 16/95  soft-tissue]
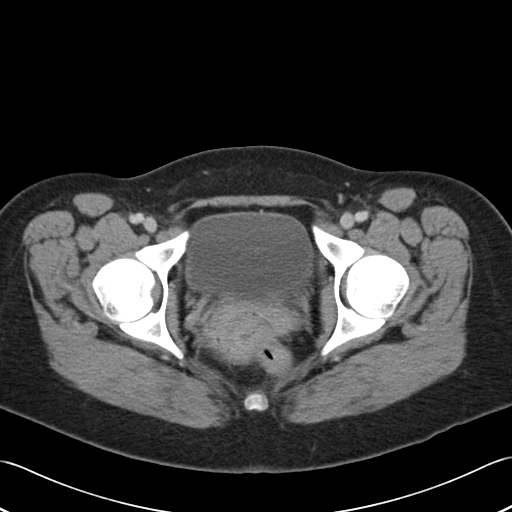
[im 23/95  soft-tissue]
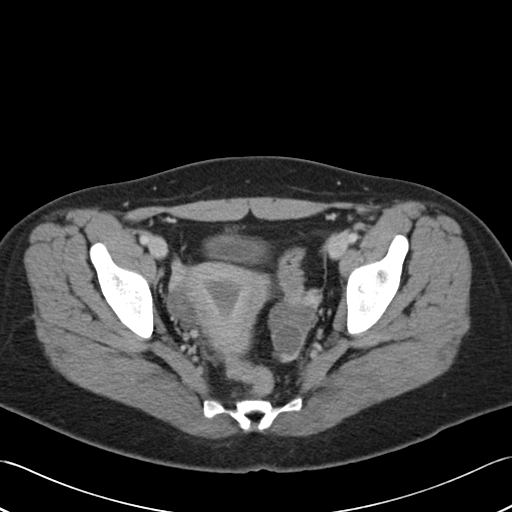
[im 31/95  soft-tissue]
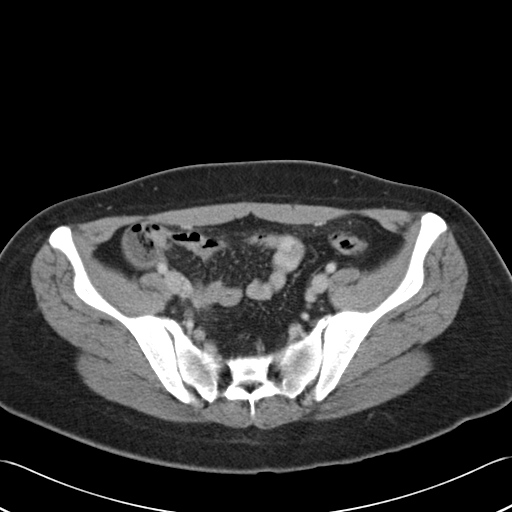
[im 38/95  soft-tissue]
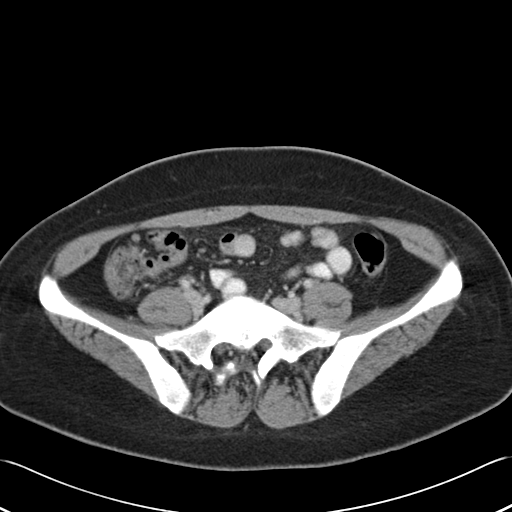
[im 46/95  soft-tissue]
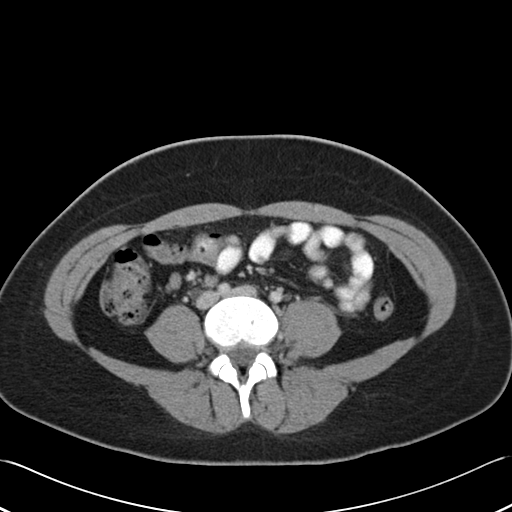
[im 53/95  soft-tissue]
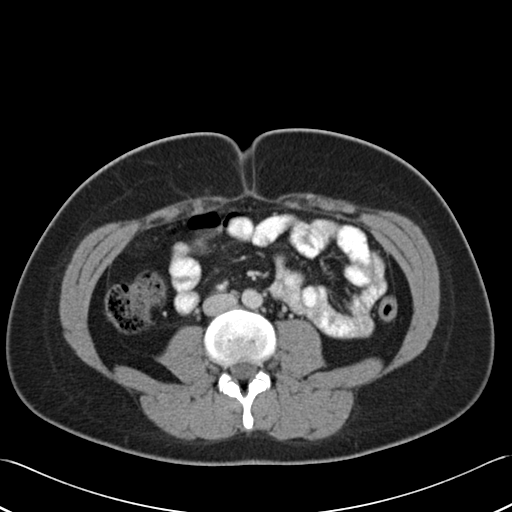
[im 61/95  soft-tissue]
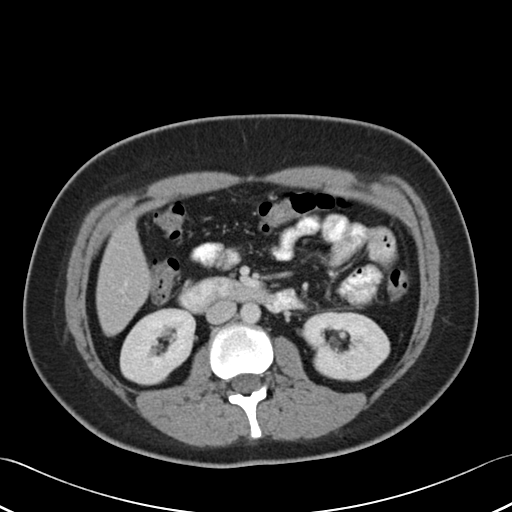
[im 68/95  soft-tissue]
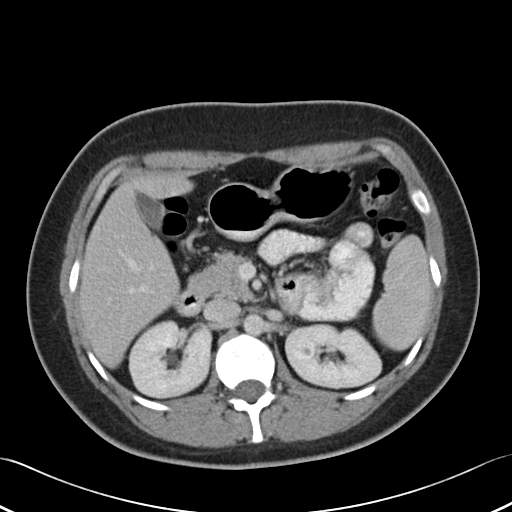
[im 68/95  bone]
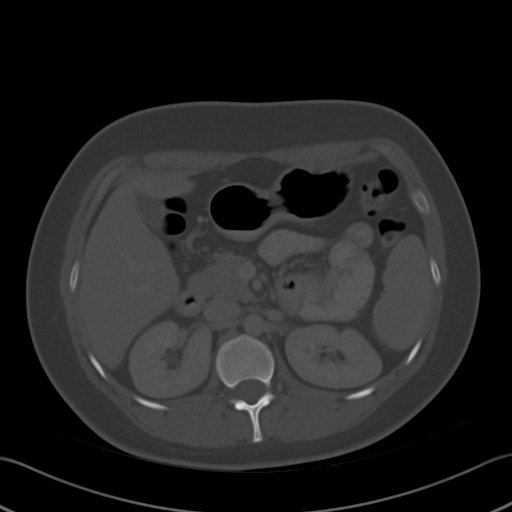
[im 76/95  soft-tissue]
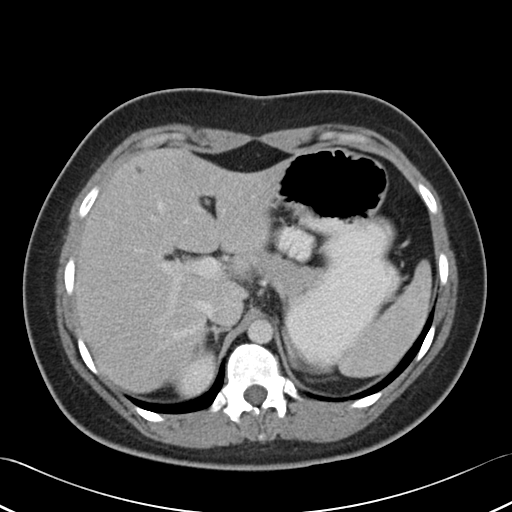
[im 83/95  soft-tissue]
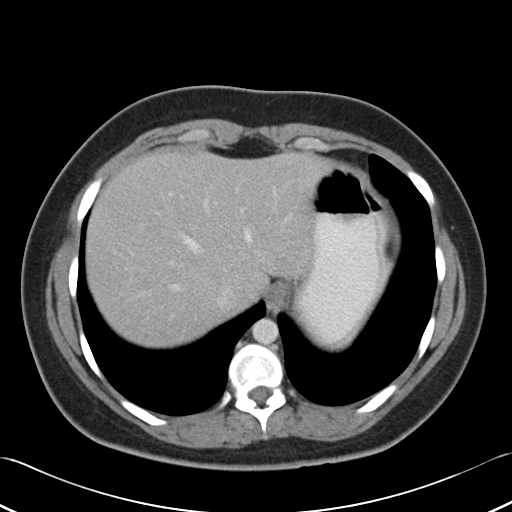
[im 91/95  soft-tissue]
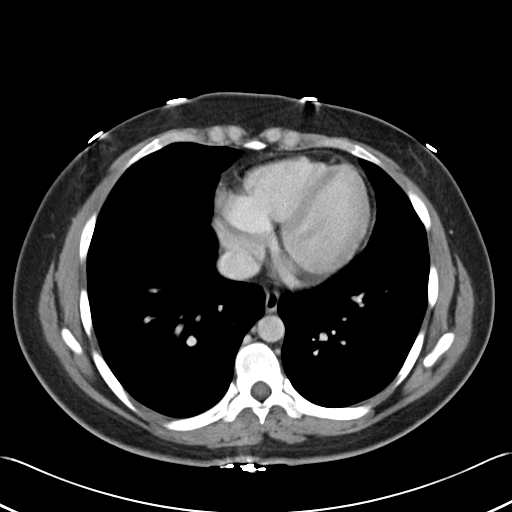

[Series 5: cor routine abd pel with · coronal · 0.63mm/px · 3 of 121 slices shown]
[im 41/121  soft-tissue]
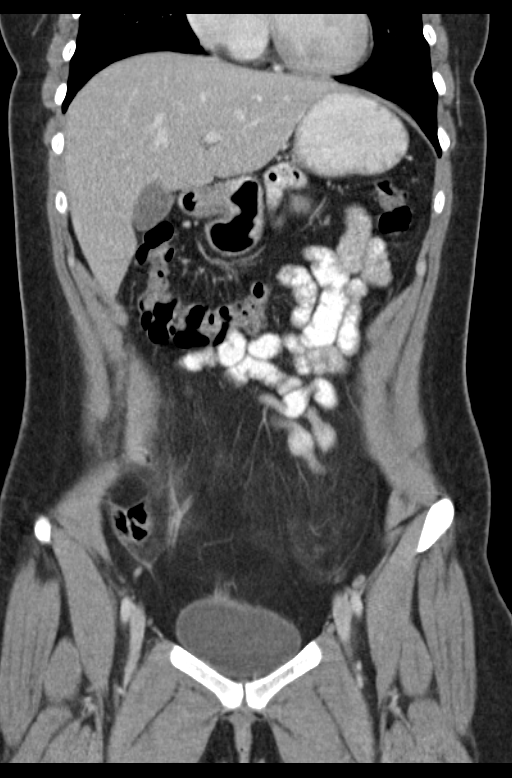
[im 54/121  soft-tissue]
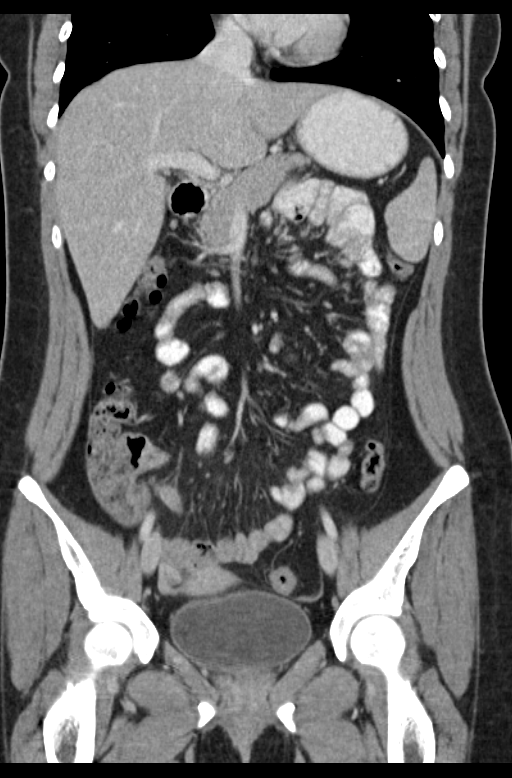
[im 67/121  soft-tissue]
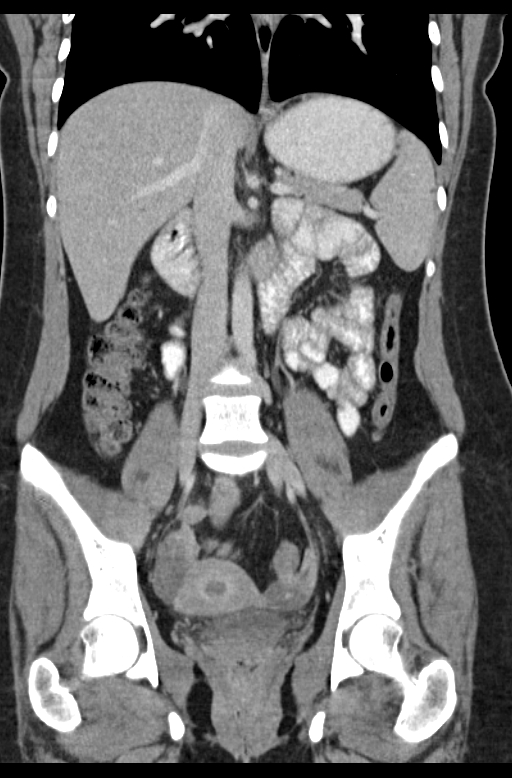

[15 of 46 positions shown; findings below may reference images not displayed]

FINDINGS: Lower chest:  No acute findings.

Hepatobiliary: No masses or other significant abnormality.

Pancreas: No mass, inflammatory changes, or other significant
abnormality.

Spleen: Within normal limits in size and appearance.

Adrenals/Urinary Tract: Normal adrenal glands. No renal obstruction
or hydronephrosis. No obstructing ureteral calculus on either side.
Right kidney demonstrates a 10 mm cortical cyst image 32 in the
lower pole. Left kidney demonstrates a upper pole punctate
nonobstructing caliceal calculus, image 27.

Stomach/Bowel: Negative for bowel obstruction, significant
dilatation, ileus, or free air. Appendix is visualized with a
thickened edematous wall and mucosal enhancement. Appendix diameter
is 12 mm as it extends over the left iliac vessels into the pelvis,
image 64. Appearance is compatible with early non ruptured acute
appendicitis. No abscess or fluid collection.

Vascular/Lymphatic: Small mildly enlarged right lower quadrant
mesenteric lymph nodes noted, images 50 through 53. Intact aorta. No
acute vascular process.

Reproductive: Uterus and adnexal normal in size. Several ovarian
follicles present bilaterally. No pelvic free fluid.

Other: No inguinal abnormality or hernia.  Intact abdominal wall.

Musculoskeletal:  No acute osseous finding.
IMPRESSION: Abnormal thick-walled, edematous and dilated appendix measuring 12
mm compatible with acute early nonruptured appendicitis.

These results were called by telephone at the time of interpretation
on 02/20/2016 at [DATE] to Dr. PROJEKTOWANIE GUST , who verbally
acknowledged these results.

## 2017-08-07 ENCOUNTER — Ambulatory Visit (INDEPENDENT_AMBULATORY_CARE_PROVIDER_SITE_OTHER): Payer: BLUE CROSS/BLUE SHIELD | Admitting: Certified Nurse Midwife

## 2017-08-07 VITALS — BP 122/76 | HR 88 | Wt 194.3 lb

## 2017-08-07 DIAGNOSIS — O1203 Gestational edema, third trimester: Secondary | ICD-10-CM

## 2017-08-07 DIAGNOSIS — Z3403 Encounter for supervision of normal first pregnancy, third trimester: Secondary | ICD-10-CM

## 2017-08-07 NOTE — Progress Notes (Signed)
ROB- Patient reports that both legs are starting to swell. She reports that it seems to be worse in the evenings.

## 2017-08-07 NOTE — Patient Instructions (Signed)

## 2017-08-08 NOTE — Progress Notes (Signed)
ROB-Reports bilateral leg swelling. Discussed home treatment measures including the use of compression stockings. Plans to transfer to [facility] for duration of care. PNV samples given. Reviewed red flag symptoms and when to call. RTC x 2 weeks for ROB here or with new care provider.

## 2017-08-14 DIAGNOSIS — O99810 Abnormal glucose complicating pregnancy: Secondary | ICD-10-CM

## 2017-08-14 HISTORY — DX: Abnormal glucose complicating pregnancy: O99.810

## 2017-08-14 LAB — HM HIV SCREENING LAB: HM HIV Screening: NEGATIVE

## 2017-09-28 ENCOUNTER — Encounter: Payer: Self-pay | Admitting: Emergency Medicine

## 2017-09-28 ENCOUNTER — Emergency Department
Admission: EM | Admit: 2017-09-28 | Discharge: 2017-09-28 | Disposition: A | Discharge status: Home / Self Care | Payer: Medicaid Other | Attending: Emergency Medicine | Admitting: Emergency Medicine

## 2017-09-28 DIAGNOSIS — Z3A37 37 weeks gestation of pregnancy: Secondary | ICD-10-CM | POA: Diagnosis not present

## 2017-09-28 DIAGNOSIS — O2686 Pruritic urticarial papules and plaques of pregnancy (PUPPP): Secondary | ICD-10-CM | POA: Insufficient documentation

## 2017-09-28 LAB — URINALYSIS, COMPLETE (UACMP) WITH MICROSCOPIC
BACTERIA UA: NONE SEEN
Bilirubin Urine: NEGATIVE
Glucose, UA: NEGATIVE mg/dL
Hgb urine dipstick: NEGATIVE
Ketones, ur: NEGATIVE mg/dL
Nitrite: NEGATIVE
PROTEIN: 30 mg/dL — AB
Specific Gravity, Urine: 1.016 (ref 1.005–1.030)
pH: 6 (ref 5.0–8.0)

## 2017-09-28 LAB — COMPREHENSIVE METABOLIC PANEL
ALT: 12 U/L — AB (ref 14–54)
AST: 16 U/L (ref 15–41)
Albumin: 2.7 g/dL — ABNORMAL LOW (ref 3.5–5.0)
Alkaline Phosphatase: 220 U/L — ABNORMAL HIGH (ref 38–126)
Anion gap: 10 (ref 5–15)
BILIRUBIN TOTAL: 0.3 mg/dL (ref 0.3–1.2)
BUN: 11 mg/dL (ref 6–20)
CHLORIDE: 105 mmol/L (ref 101–111)
CO2: 20 mmol/L — ABNORMAL LOW (ref 22–32)
Calcium: 8.7 mg/dL — ABNORMAL LOW (ref 8.9–10.3)
Creatinine, Ser: 0.59 mg/dL (ref 0.44–1.00)
GFR calc Af Amer: >60 mL/min (ref >60)
Glucose, Bld: 82 mg/dL (ref 65–99)
Potassium: 4.4 mmol/L (ref 3.5–5.1)
Sodium: 135 mmol/L (ref 135–145)
Total Protein: 6.8 g/dL (ref 6.5–8.1)

## 2017-09-28 LAB — CBC WITH DIFFERENTIAL/PLATELET
BASOS ABS: 0 10*3/uL (ref 0–0.1)
Basophils Relative: 0 %
Eosinophils Absolute: 0.1 10*3/uL (ref 0–0.7)
Eosinophils Relative: 1 %
HEMATOCRIT: 36.5 % (ref 35.0–47.0)
Hemoglobin: 12.4 g/dL (ref 12.0–16.0)
LYMPHS PCT: 19 %
Lymphs Abs: 1.5 10*3/uL (ref 1.0–3.6)
MCH: 29.6 pg (ref 26.0–34.0)
MCHC: 33.9 g/dL (ref 32.0–36.0)
MCV: 87.3 fL (ref 80.0–100.0)
Monocytes Absolute: 0.2 10*3/uL (ref 0.2–0.9)
Monocytes Relative: 3 %
NEUTROS ABS: 6.3 10*3/uL (ref 1.4–6.5)
Neutrophils Relative %: 77 %
PLATELETS: 261 10*3/uL (ref 150–440)
RBC: 4.18 MIL/uL (ref 3.80–5.20)
RDW: 14.2 % (ref 11.5–14.5)
WBC: 8.2 10*3/uL (ref 3.6–11.0)

## 2017-09-28 MED ORDER — DIPHENHYDRAMINE HCL 25 MG PO CAPS
25.0000 mg | ORAL_CAPSULE | Freq: Once | ORAL | Status: AC
  Administered 2017-09-28: 25 mg via ORAL
  Filled 2017-09-28: qty 1

## 2017-09-28 MED ORDER — METHYLPREDNISOLONE SODIUM SUCC 125 MG IJ SOLR
125.0000 mg | Freq: Once | INTRAMUSCULAR | Status: AC
  Administered 2017-09-28: 125 mg via INTRAMUSCULAR
  Filled 2017-09-28: qty 2

## 2017-09-28 MED ORDER — PREDNISONE 10 MG PO TABS: 30.0000 mg | ORAL_TABLET | Freq: Every day | ORAL | 0 refills | Status: DC

## 2017-09-28 MED ORDER — DIPHENHYDRAMINE HCL 25 MG PO CAPS: 25.0000 mg | ORAL_CAPSULE | Freq: Four times a day (QID) | ORAL | 0 refills | Status: DC | PRN

## 2017-09-28 NOTE — Discharge Instructions (Addendum)
Take Benadryl every 6 hours for redness and itching.  Take the steroid once daily for the next 3 days.  See your prenatal doctor as soon as possible for a recheck.  Come back to the ER for symptoms that change or worsen.

## 2017-09-28 NOTE — ED Provider Notes (Addendum)
[facility] Emergency Department Provider Note  ____________________________________________  Time seen: Approximately 8:41 AM  I have reviewed the triage vital signs and the nursing notes.   HISTORY  Chief Complaint Rash   HPI Alexandra Fernandez is a 27 y.o. female who presents to the emergency department for treatment and evaluation of a rash that started yesterday upon awakening.  She states that she had developed itching and red whelps on her hands and feet yesterday morning.  Throughout the day and overnight, the rash has now spread to her face, trunk, and lower extremities.  She has noticed some swelling in her hands.  She denies any difficulty breathing or feeling that her tongue or throat is swelling.  She denies swelling in her lower extremities.  She has had no new environmental exposures that she is aware of.  She has had no recent changes to hygiene products.  She is [redacted] weeks pregnant. G1P0.  History reviewed. No pertinent past medical history.  There are no active problems to display for this patient.   Past Surgical History:  Procedure Laterality Date  . APPENDECTOMY    . LAPAROSCOPIC APPENDECTOMY N/A 02/20/2016   Procedure: APPENDECTOMY LAPAROSCOPIC;  Surgeon: Florene Glen, MD;  Location: [facility] ORS;  Service: General;  Laterality: N/A;    Prior to Admission medications   Medication Sig Start Date End Date Taking? Authorizing Provider  diphenhydrAMINE (BENADRYL) 25 mg capsule Take 1 capsule (25 mg total) by mouth every 6 (six) hours as needed. 09/28/17   Triplett, Cari B, FNP  predniSONE (DELTASONE) 10 MG tablet Take 3 tablets (30 mg total) by mouth daily. 09/28/17   Triplett, Dessa Phi, FNP  Prenatal Vit-Fe Fumarate-FA (PRENATAL MULTIVITAMIN) TABS tablet Take 1 tablet by mouth daily at 12 noon.    [provider]    Allergies Patient has no known allergies.  Family History  Problem Relation Age of Onset  . Cancer Maternal  Grandmother   . Stroke Paternal Grandmother   . Alcohol abuse Neg Hx   . Arthritis Neg Hx   . Asthma Neg Hx   . Birth defects Neg Hx   . COPD Neg Hx   . Depression Neg Hx   . Diabetes Neg Hx   . Drug abuse Neg Hx   . Early death Neg Hx   . Hearing loss Neg Hx   . Heart disease Neg Hx   . Hyperlipidemia Neg Hx   . Hypertension Neg Hx   . Kidney disease Neg Hx   . Learning disabilities Neg Hx   . Mental illness Neg Hx   . Mental retardation Neg Hx   . Miscarriages / Stillbirths Neg Hx   . Vision loss Neg Hx   . Varicose Veins Neg Hx     Social History Social History   Tobacco Use  . Smoking status: Never Smoker  . Smokeless tobacco: Never Used  Substance Use Topics  . Alcohol use: No    Comment: prior to pregnancy  . Drug use: No    Review of Systems  Constitutional: Negative for fever. Respiratory: Negative for cough or shortness of breath.  Musculoskeletal: Negative for myalgias Skin: Positive for pruritic rash Neurological: Negative for numbness or paresthesias. ____________________________________________   PHYSICAL EXAM:  VITAL SIGNS: ED Triage Vitals  Enc Vitals Group     BP 09/28/17 0739 (!) 158/100     Pulse Rate 09/28/17 0739 (!) 108     Resp 09/28/17 0739 20  Temp 09/28/17 0739 98.4 F (36.9 C)     Temp Source 09/28/17 0739 Oral     SpO2 09/28/17 0739 99 %     Weight 09/28/17 0740 202 lb (91.6 kg)     Height 09/28/17 0740 5\' 2"  (1.575 m)     Head Circumference --      Peak Flow --      Pain Score --      Pain Loc --      Pain Edu? --      Excl. in [place]? --      Constitutional: Well appearing. Eyes: Conjunctivae are clear without discharge or drainage. Nose: No rhinorrhea noted. Mouth/Throat: Airway is patent.  Neck: No stridor. Unrestricted range of motion observed. Lymphatic: No palpable anterior cervical lymphadenopathy Cardiovascular: Capillary refill is <3 seconds.  Respiratory: Respirations are even and  unlabored.. Musculoskeletal: Unrestricted range of motion observed. Neurologic: Awake, alert, and oriented x 4.  Skin: Diffuse, urticarial lesions are noted on the neck and diffusely over the trunk and extremities.  Erythema and mild edema noted of bilateral hands.  Lesions also present on the lower extremities.  No edema of the lower extremities or feet.  ____________________________________________   LABS (all labs ordered are listed, but only abnormal results are displayed)  Labs Reviewed  URINALYSIS, COMPLETE (UACMP) WITH MICROSCOPIC - Abnormal; Notable for the following components:      Result Value   Color, Urine YELLOW (*)    APPearance HAZY (*)    Protein, ur 30 (*)    Leukocytes, UA TRACE (*)    Squamous Epithelial / LPF 6-30 (*)    All other components within normal limits  COMPREHENSIVE METABOLIC PANEL - Abnormal; Notable for the following components:   CO2 20 (*)    Calcium 8.7 (*)    Albumin 2.7 (*)    ALT 12 (*)    Alkaline Phosphatase 220 (*)    All other components within normal limits  CBC WITH DIFFERENTIAL/PLATELET   ____________________________________________  EKG  Not indicated ____________________________________________  RADIOLOGY  Not indicated ____________________________________________   PROCEDURES  Procedures ____________________________________________   INITIAL IMPRESSION / ASSESSMENT AND PLAN / ED COURSE  Alexandra Fernandez is a 27 y.o. female who presents to the emergency department for evaluation and treatment of rash and swelling of her hands.  Triage vitals are noted and the patient appears to have been hypertensive.  Review of her last 2 office visits reveals no history of hypertension during this pregnancy.  Her rash will be treated initially with Benadryl.  Further evaluation of the hypertension and possibility of preeclampsia will be investigated by repeat vital signs and obtaining  urinalysis.  ----------------------------------------- 9:49 AM on 09/28/2017 -----------------------------------------  Protein noted in urine, but blood pressure is no longer elevated. Will get labs and recheck BP 4 hours post initial hypertensive reading.   ----------------------------------------- 11:52 AM on 09/28/2017 -----------------------------------------  BP remains normal. Patient to be discharged home to follow up with her OB tomorrow as scheduled. She has had little to no relief with the benadryl and is exceptionally concerned about the swelling in her hands. Solu medrol to be given prior to discharge. She will take benadryl every 6 hours and 30mg  of prednisone x 3 days for swelling and itching. She is to return to the ER for symptoms that change or worsen if unable to see the OB.  Medications  methylPREDNISolone sodium succinate (SOLU-MEDROL) 125 mg/2 mL injection 125 mg (not administered)  diphenhydrAMINE (BENADRYL) capsule 25  mg (25 mg Oral Given 09/28/17 1031)     Pertinent labs & imaging results that were available during my care of the patient were reviewed by me and considered in my medical decision making (see chart for details). ____________________________________________   FINAL CLINICAL IMPRESSION(S) / ED DIAGNOSES  Final diagnoses:  PUPP (pruritic urticarial papules and plaques of pregnancy)    ED Discharge Orders        Ordered    predniSONE (DELTASONE) 10 MG tablet  Daily     09/28/17 1149    diphenhydrAMINE (BENADRYL) 25 mg capsule  Every 6 hours PRN     09/28/17 1149       Note:  This document was prepared using Dragon voice recognition software and may include unintentional dictation errors.    Victorino Dike, FNP 09/28/17 1155    Lisa Roca, MD 09/28/17 1342    Lisa Roca, MD 09/28/17 1352

## 2017-09-28 NOTE — ED Provider Notes (Signed)
This patient was not seen by me, seen by nurse practitioner.  I reviewed the chart with elevated blood pressure, elevated alk phos and protein in urine.  Called the patient she is not having any abdominal pain.  She is followed by [facility] for [facility] care and is apparently around [redacted] weeks pregnant.  I spoke with Dr. Marcelline Mates, unassigned OB/GYN here with regard to elevated blood pressure x1 here, reported in the documentation although I do not see the exact number that blood pressure was normal upon repeat.  Dr. Marcelline Mates agreed that follow-up tomorrow at the community center is able for repeat blood pressure.  I called patient back to let her know definitely keep her appointment tomorrow for repeat blood pressure check, and certainly to return if she gets any abdominal pain blurry vision or headaches.   Lisa Roca, MD 09/28/17 [phone]

## 2017-09-28 NOTE — ED Triage Notes (Signed)
Pt reports yesterday started with a rash over her body on her neck, foot, hands and face and now her hands are swelling and the areas itch. Pt [redacted] weeks pregnant, denies complications or abdominal pain.

## 2017-09-28 NOTE — ED Notes (Signed)
See triage note   states she developed hand swelling and rash yesterday  Rash noted to face,hand and feet  No resp distress noted  Positive itching

## 2017-10-14 DIAGNOSIS — O1490 Unspecified pre-eclampsia, unspecified trimester: Secondary | ICD-10-CM

## 2017-10-14 DIAGNOSIS — O1413 Severe pre-eclampsia, third trimester: Secondary | ICD-10-CM

## 2017-10-14 HISTORY — DX: Unspecified pre-eclampsia, unspecified trimester: O14.90

## 2017-10-28 DIAGNOSIS — D259 Leiomyoma of uterus, unspecified: Secondary | ICD-10-CM | POA: Insufficient documentation

## 2017-10-28 HISTORY — DX: Leiomyoma of uterus, unspecified: D25.9

## 2018-01-08 ENCOUNTER — Encounter (HOSPITAL_COMMUNITY): Payer: Self-pay

## 2019-09-27 ENCOUNTER — Telehealth: Payer: Self-pay | Admitting: Licensed Clinical Social Worker

## 2019-09-27 NOTE — Telephone Encounter (Signed)
Patient left vm for LCSW. LCSW returned call with language line [phone]- patient shared it wasnt a good time. LCSW encouraged her to call back and leave a vm and she would return the call again/at another time.

## 2019-11-10 ENCOUNTER — Ambulatory Visit: Payer: Medicaid Other | Attending: Internal Medicine

## 2019-11-10 DIAGNOSIS — Z23 Encounter for immunization: Secondary | ICD-10-CM

## 2019-11-10 NOTE — Progress Notes (Signed)
   Covid-19 Vaccination Clinic  Name:  Sheng Inboden    MRN: [id number] DOB: April 08, 1991  11/10/2019  Ms. Rosalena Immerman was observed post Covid-19 immunization for 15 minutes without incident. She was provided with Vaccine Information Sheet and instruction to access the V-Safe system.   Ms. Aveyah Martinell was instructed to call 911 with any severe reactions post vaccine: Marland Kitchen Difficulty breathing  . Swelling of face and throat  . A fast heartbeat  . A bad rash all over body  . Dizziness and weakness   Immunizations Administered    Name Date Dose VIS Date Route   Moderna COVID-19 Vaccine 11/10/2019 11:02 AM 0.5 mL 07/06/2019 Intramuscular   Manufacturer: Moderna   Lot: [id number]   [id number]

## 2019-11-16 ENCOUNTER — Other Ambulatory Visit: Payer: Self-pay | Admitting: Family Medicine

## 2019-12-08 ENCOUNTER — Ambulatory Visit: Payer: Medicaid Other | Attending: Internal Medicine

## 2019-12-08 DIAGNOSIS — Z23 Encounter for immunization: Secondary | ICD-10-CM

## 2019-12-08 NOTE — Progress Notes (Signed)
   Covid-19 Vaccination Clinic  Name:  Alexandra Fernandez    MRN: [id number] DOB: 05-Sep-1990  12/08/2019  [name] was observed post Covid-19 immunization for 15 minutes without incident. She was provided with Vaccine Information Sheet and instruction to access the V-Safe system.   [name] was instructed to call 911 with any severe reactions post vaccine: Marland Kitchen Difficulty breathing  . Swelling of face and throat  . A fast heartbeat  . A bad rash all over body  . Dizziness and weakness   Immunizations Administered    Name Date Dose VIS Date Route   Moderna COVID-19 Vaccine 12/08/2019 10:53 AM 0.5 mL 07/2019 Intramuscular   Manufacturer: Moderna   Lot: [id number]   [id number]

## 2020-03-17 ENCOUNTER — Ambulatory Visit: Payer: Medicaid Other

## 2020-03-24 ENCOUNTER — Ambulatory Visit: Payer: Medicaid Other

## 2020-03-24 ENCOUNTER — Encounter: Payer: Self-pay | Admitting: Physician Assistant

## 2020-03-24 ENCOUNTER — Other Ambulatory Visit: Payer: Self-pay

## 2020-03-24 ENCOUNTER — Ambulatory Visit (LOCAL_COMMUNITY_HEALTH_CENTER): Payer: 59 | Admitting: Physician Assistant

## 2020-03-24 VITALS — BP 111/67 | Ht 65.0 in | Wt 178.0 lb

## 2020-03-24 DIAGNOSIS — Z3009 Encounter for other general counseling and advice on contraception: Secondary | ICD-10-CM

## 2020-03-24 DIAGNOSIS — Z3046 Encounter for surveillance of implantable subdermal contraceptive: Secondary | ICD-10-CM | POA: Diagnosis not present

## 2020-03-24 DIAGNOSIS — Z0000 Encounter for general adult medical examination without abnormal findings: Secondary | ICD-10-CM

## 2020-03-24 MED ORDER — PRENATAL VITAMINS 28-0.8 MG PO TABS: 1.0000 | ORAL_TABLET | Freq: Every day | ORAL | 0 refills | Status: DC

## 2020-03-24 NOTE — Progress Notes (Signed)
Pt scheduled for physical. Desires nexplanon removal (placed 12/09/17); pt c/o of lots of irregular bleeding and periods, headaches, abdominal aches, sore breasts when having periods all the time, and moody.

## 2020-03-24 NOTE — Progress Notes (Signed)
Family Planning Visit- Repeat Yearly Visit  Subjective:  Alexandra Fernandez is a 29 y.o. G1P1001  being seen today for an well woman visit and to discuss family planning options.    She is currently using Nexplanon for pregnancy prevention. Patient reports she does  want a pregnancy in the next year. Patient  has Uterine fibroid on their problem list.  Chief Complaint  Patient presents with  . Contraception    PE and Nexplanon removal    Patient reports that she had no bleeding for a year after Nexplanon insertion, then had a few months of regular bleeding but lately she has had a lot of irregular bleeding where she will bleed for 2-3 weeks at a time with breast tenderness, bloating, and sometimes feeling short of breath with increased exertion if she has been having heavy bleeding.  States that she is to see a PCP for evaluation of headaches soon.  PHQ-9=8, denies suicidal or homicidal ideation or plan and states that she is worried about the headaches and irregular bleeding.   Patient denies any other concerns today.    See flowsheet for other program required questions.   Body mass index is 29.62 kg/m. - Patient is eligible for diabetes screening based on BMI and age >66?  not applicable AY3K ordered? not applicable  Patient reports 1 of partners in last year. Desires STI screening?  No - patient declines.   Has patient been screened once for HCV in the past?  No  No results found for: HCVAB  Does the patient have current of drug use, have a partner with drug use, and/or has been incarcerated since last result? No  If yes-- Screen for HCV through [facility]   Does the patient meet criteria for HBV testing? No  Criteria:  -Household, sexual or needle sharing contact with HBV -History of drug use -HIV positive -Those with known Hep C   Health Maintenance Due  Topic Date Due  . Hepatitis C Screening  Never done  . TETANUS/TDAP  Never done  . INFLUENZA VACCINE   03/05/2020  . PAP-Cervical Cytology Screening  03/31/2020  . PAP SMEAR-Modifier  03/31/2020    Review of Systems  All other systems reviewed and are negative.   The following portions of the patient's history were reviewed and updated as appropriate: allergies, current medications, past family history, past medical history, past social history, past surgical history and problem list. Problem list updated.  Objective:   Vitals:   03/24/20 1413  BP: 111/67  Weight: 178 lb (80.7 kg)  Height: 5\' 5"  (1.651 m)    Physical Exam Vitals and nursing note reviewed.  Constitutional:      General: She is not in acute distress.    Appearance: Normal appearance.  HENT:     Head: Normocephalic and atraumatic.  Eyes:     Conjunctiva/sclera: Conjunctivae normal.  Neck:     Thyroid: No thyroid mass, thyromegaly or thyroid tenderness.  Cardiovascular:     Rate and Rhythm: Normal rate and regular rhythm.  Pulmonary:     Effort: Pulmonary effort is normal.     Breath sounds: Normal breath sounds.  Abdominal:     Palpations: Abdomen is soft. There is no mass.     Tenderness: There is no abdominal tenderness. There is no guarding or rebound.  Musculoskeletal:     Cervical back: Neck supple. No tenderness.  Lymphadenopathy:     Cervical: No cervical adenopathy.  Skin:  General: Skin is warm and dry.     Findings: No bruising, erythema, lesion or rash.  Neurological:     Mental Status: She is alert and oriented to person, place, and time.  Psychiatric:        Mood and Affect: Mood normal.        Behavior: Behavior normal.        Thought Content: Thought content normal.        Judgment: Judgment normal.       Assessment and Plan:  Alexandra Fernandez is a 29 y.o. female G1P1001 presenting to the [facility] Department for an yearly well woman exam/family planning visit  Contraception counseling: Reviewed all forms of birth control options in the tiered based approach.  available including abstinence; over the counter/barrier methods; hormonal contraceptive medication including pill, patch, ring, injection,contraceptive implant, ECP; hormonal and nonhormonal IUDs; permanent sterilization options including vasectomy and the various tubal sterilization modalities. Risks, benefits, and typical effectiveness rates were reviewed.  Questions were answered.  Written information was also given to the patient to review.  Patient desires to remove the Nexplanon and use condoms until ready to achieve pregnancy, this was prescribed for patient. She will follow up in 2-3 weeks for pap, 1 year and prn for surveillance.  She was told to call with any further questions, or with any concerns about this method of contraception.  Emphasized use of condoms 100% of the time for STI prevention.  Patient was not a candidate for ECP today.    1. Encounter for counseling regarding contraception Counseled patient of chance of pregnancy soon after Nexplanon removal.  Rec condoms with all sex until normal bleeding pattern has returned to plan for achieving pregnancy.  2. Nexplanon removal Nexplanon Removal Patient identified, informed consent performed, consent signed.   Appropriate time out taken. Nexplanon site identified.  Area prepped in usual sterile fashon. 2 ml of 1% lidocaine with Epinephrine was used to anesthetize the area at the distal end of the implant and along implant site. A small stab incision was made right beside the implant on the distal portion.  The Nexplanon rod was grasped manually and removed without difficulty.  There was minimal blood loss. There were no complications.  Steri-strips were applied over the small incision.  A pressure bandage was applied to reduce any bruising.  The patient tolerated the procedure well and was given post procedure instructions.    Nexplanon:   Counseled patient to take OTC analgesic starting as soon as lidocaine starts to wear off and  take regularly for at least 48 hr to decrease discomfort.  Specifically to take with food or milk to decrease stomach upset and for IB 600 mg (3 tablets) every 6 hrs; IB 800 mg (4 tablets) every 8 hrs; or Aleve 2 tablets every 12 hrs.    3. Well woman exam (no gynecological exam) Reviewed with patient healthy habits for healthy self, partner and pregnancy. Enc PNV 1 po daily. Enc to establish with/follow up with PCP for headache evaluation, primary care concerns and illness.  PHQ-9=8; patient declines LCSW referral and card today.  Counseled that she can call back if she changes her mind. - Prenatal Vit-Fe Fumarate-FA (PRENATAL VITAMINS) 28-0.8 MG TABS; Take 1 tablet by mouth daily.  Dispense: 100 tablet; Refill: 0     Return for RTC for pap once vaginal bleeding is cleared.  No future appointments.  Jerene Dilling, PA

## 2020-12-14 ENCOUNTER — Other Ambulatory Visit: Payer: Self-pay

## 2020-12-14 ENCOUNTER — Ambulatory Visit (LOCAL_COMMUNITY_HEALTH_CENTER): Payer: Medicaid Other

## 2020-12-14 VITALS — BP 108/70 | HR 81 | Wt 182.0 lb

## 2020-12-14 DIAGNOSIS — Z3201 Encounter for pregnancy test, result positive: Secondary | ICD-10-CM

## 2020-12-14 LAB — PREGNANCY, URINE: Preg Test, Ur: POSITIVE — AB

## 2020-12-14 MED ORDER — PRENATAL 27-0.8 MG PO TABS: 1.0000 | ORAL_TABLET | Freq: Every day | ORAL | 0 refills | Status: DC

## 2020-12-14 NOTE — Progress Notes (Signed)
Patient presents to nurse clinic for pregnancy test. Results discussed with patient. Prenatal vitamins dispensed per standing order and edu. provided. Counseling education provided and Pt. voiced understanding.

## 2020-12-21 ENCOUNTER — Encounter: Payer: Self-pay | Admitting: Emergency Medicine

## 2020-12-21 ENCOUNTER — Other Ambulatory Visit: Payer: Self-pay

## 2020-12-21 ENCOUNTER — Emergency Department
Admission: EM | Admit: 2020-12-21 | Discharge: 2020-12-21 | Disposition: A | Discharge status: Home / Self Care | Payer: No Typology Code available for payment source | Attending: Emergency Medicine | Admitting: Emergency Medicine

## 2020-12-21 DIAGNOSIS — S61012A Laceration without foreign body of left thumb without damage to nail, initial encounter: Secondary | ICD-10-CM

## 2020-12-21 DIAGNOSIS — W260XXA Contact with knife, initial encounter: Secondary | ICD-10-CM | POA: Insufficient documentation

## 2020-12-21 DIAGNOSIS — Z87891 Personal history of nicotine dependence: Secondary | ICD-10-CM | POA: Insufficient documentation

## 2020-12-21 DIAGNOSIS — S6992XA Unspecified injury of left wrist, hand and finger(s), initial encounter: Secondary | ICD-10-CM | POA: Diagnosis present

## 2020-12-21 MED ORDER — LIDOCAINE HCL (PF) 1 % IJ SOLN
5.0000 mL | Freq: Once | INTRAMUSCULAR | Status: AC
  Administered 2020-12-21: 5 mL via INTRADERMAL
  Filled 2020-12-21: qty 5

## 2020-12-21 NOTE — ED Triage Notes (Signed)
Laceration to left hand.  States cut hand at work with knife.  Dressing intact.  Bleeding controlled.

## 2020-12-21 NOTE — Discharge Instructions (Signed)
Keep the area as clean and dry as possible.  Allow the air to get to the wound so we will heal faster.  Return if worsening.  Suture should be removed in 7 days.

## 2020-12-21 NOTE — ED Provider Notes (Signed)
[facility] Emergency Department Provider Note  ____________________________________________   Event Date/Time   First MD Initiated Contact with Patient 12/21/20 1241     (approximate)  I have reviewed the triage vital signs and the nursing notes.   HISTORY  Chief Complaint Laceration    HPI Alexandra Fernandez is a 30 y.o. female presents emergency department with a laceration to the left hand.  She was using a box cutter to open a package when she cut her self.  No numbness or tingling.  Tdap is up-to-date.    Past Medical History:  Diagnosis Date  . Anxiety and depression     Patient Active Problem List   Diagnosis Date Noted  . Uterine fibroid 10/28/2017    Past Surgical History:  Procedure Laterality Date  . APPENDECTOMY    . CESAREAN SECTION    . LAPAROSCOPIC APPENDECTOMY N/A 02/20/2016   Procedure: APPENDECTOMY LAPAROSCOPIC;  Surgeon: Florene Glen, MD;  Location: [facility] ORS;  Service: General;  Laterality: N/A;    Prior to Admission medications   Medication Sig Start Date End Date Taking? Authorizing Provider  Biotin 1 MG CAPS Take 1 tablet by mouth. Unsure of mg Patient not taking: Reported on 12/14/2020    [provider]  etonogestrel (NEXPLANON) 68 MG IMPL implant 1 each by Subdermal route once. Patient not taking: Reported on 12/14/2020 12/09/17   Willaim Sheng, NP  Prenatal Vit-Fe Fumarate-FA (PRENATAL VITAMINS) 28-0.8 MG TABS Take 1 tablet by mouth daily. 03/24/20   Jerene Dilling, PA    Allergies Patient has no known allergies.  Family History  Problem Relation Age of Onset  . Cancer Maternal Grandmother        lung  . Stroke Paternal Grandmother   . Heart disease Paternal Grandmother        had pacemaker  . Asthma Mother   . Anemia Mother   . Fibroids Mother   . Kidney Stones Sister   . Diabetes Father        prediabetic  . Alcohol abuse Neg Hx   . Arthritis Neg Hx   . Birth defects Neg Hx   .  COPD Neg Hx   . Depression Neg Hx   . Drug abuse Neg Hx   . Early death Neg Hx   . Hearing loss Neg Hx   . Hyperlipidemia Neg Hx   . Hypertension Neg Hx   . Kidney disease Neg Hx   . Learning disabilities Neg Hx   . Mental illness Neg Hx   . Mental retardation Neg Hx   . Miscarriages / Stillbirths Neg Hx   . Vision loss Neg Hx   . Varicose Veins Neg Hx     Social History Social History   Tobacco Use  . Smoking status: Former Research scientist (life sciences)  . Smokeless tobacco: Never Used  . Tobacco comment: quit when she was 30 years old  Vaping Use  . Vaping Use: Never used  Substance Use Topics  . Alcohol use: Not Currently    Comment: social drinker   . Drug use: Never    Review of Systems  Constitutional: No fever/chills Eyes: No visual changes. ENT: No sore throat. Respiratory: Denies cough Cardiovascular: Denies chest pain Gastrointestinal: Denies abdominal pain Genitourinary: Negative for dysuria. Musculoskeletal: Negative for back pain. Skin: Negative for rash.  Positive laceration Psychiatric: no mood changes,     ____________________________________________   PHYSICAL EXAM:  VITAL SIGNS: ED Triage Vitals  Enc Vitals  Group     BP 12/21/20 1247 (!) 111/52     Pulse Rate 12/21/20 1247 65     Resp 12/21/20 1247 18     Temp 12/21/20 1247 98.1 F (36.7 C)     Temp Source 12/21/20 1247 Oral     SpO2 12/21/20 1247 100 %     Weight 12/21/20 1130 180 lb 12.4 oz (82 kg)     Height 12/21/20 1130 5\' 5"  (1.651 m)     Head Circumference --      Peak Flow --      Pain Score 12/21/20 1130 0     Pain Loc --      Pain Edu? --      Excl. in [place]? --     Constitutional: Alert and oriented. Well appearing and in no acute distress. Eyes: Conjunctivae are normal.  Head: Atraumatic. Nose: No congestion/rhinnorhea. Mouth/Throat: Mucous membranes are moist.   Neck:  supple no lymphadenopathy noted Cardiovascular: Normal rate, regular rhythm.  Respiratory: Normal respiratory effort.   No retractions, GU: deferred Musculoskeletal: FROM all extremities, warm and well perfused, linear laceration noted on the dorsum of the left thumb, no foreign body, neurovascular is intact Neurologic:  Normal speech and language.  Skin:  Skin is warm, dry . No rash noted. Psychiatric: Mood and affect are normal. Speech and behavior are normal.  ____________________________________________   LABS (all labs ordered are listed, but only abnormal results are displayed)  Labs Reviewed - No data to display ____________________________________________   ____________________________________________  RADIOLOGY    ____________________________________________   PROCEDURES  Procedure(s) performed:   Marland KitchenMarland KitchenLaceration Repair  Date/Time: 12/21/2020 1:35 PM Performed by: Versie Starks, PA-C Authorized by: Versie Starks, PA-C   Consent:    Consent obtained:  Verbal   Consent given by:  Patient   Risks discussed:  Infection, pain, poor cosmetic result, poor wound healing, tendon damage and retained foreign body Universal protocol:    Procedure explained and questions answered to patient or proxy's satisfaction: yes     Patient identity confirmed:  Verbally with patient Anesthesia:    Anesthesia method:  Local infiltration   Local anesthetic:  Lidocaine 1% w/o epi Laceration details:    Location:  Finger   Finger location:  L thumb   Length (cm):  2.5 Pre-procedure details:    Preparation:  Patient was prepped and draped in usual sterile fashion Exploration:    Hemostasis achieved with:  Direct pressure   Imaging outcome: foreign body not noted     Wound exploration: wound explored through full range of motion     Wound extent: no foreign bodies/material noted, no muscle damage noted, no nerve damage noted, no tendon damage noted, no underlying fracture noted and no vascular damage noted     Contaminated: no   Treatment:    Area cleansed with:  Povidone-iodine and saline    Amount of cleaning:  Standard   Irrigation solution:  Sterile saline   Irrigation method:  Tap   Debridement:  None   Undermining:  None   Scar revision: no   Skin repair:    Repair method:  Sutures   Suture size:  5-0   Suture material:  Nylon   Suture technique:  Simple interrupted   Number of sutures:  5 Approximation:    Approximation:  Close Repair type:    Repair type:  Simple Post-procedure details:    Dressing:  Non-adherent dressing   Procedure completion:  Tolerated well,  no immediate complications      ____________________________________________   INITIAL IMPRESSION / ASSESSMENT AND PLAN / ED COURSE  Pertinent labs & imaging results that were available during my care of the patient were reviewed by me and considered in my medical decision making (see chart for details).   Patient's 30 year old female presents emergency department laceration to the left thumb.  See HPI.  Physical exam shows patient appears   For laceration repair see  procedure note  I did explain her thing to the patient.  Due to her being Worker's Comp. by did give her a note stating she cannot wear gloves for the next week.  She is to keep the area as dry as possible.  Have sutures removed in 1 week.  Return if any sign of infection.  She was discharged stable condition.  Alexandra Fernandez was evaluated in Emergency Department on 12/21/2020 for the symptoms described in the history of present illness. She was evaluated in the context of the global COVID-19 pandemic, which necessitated consideration that the patient might be at risk for infection with the SARS-CoV-2 virus that causes COVID-19. Institutional protocols and algorithms that pertain to the evaluation of patients at risk for COVID-19 are in a state of rapid change based on information released by regulatory bodies including the CDC and federal and state organizations. These policies and algorithms were followed during the patient's care  in the ED.    As part of my medical decision making, I reviewed the following data within the [facility] notes reviewed and incorporated, Old chart reviewed, Notes from prior ED visits and  Controlled Substance Database  ____________________________________________   FINAL CLINICAL IMPRESSION(S) / ED DIAGNOSES  Final diagnoses:  Laceration of left thumb without foreign body without damage to nail, initial encounter      NEW MEDICATIONS STARTED DURING THIS VISIT:  New Prescriptions   No medications on file     Note:  This document was prepared using Dragon voice recognition software and may include unintentional dictation errors.    Versie Starks, PA-C 12/21/20 1337    Carrie Mew, MD 12/21/20 [phone]

## 2020-12-21 NOTE — ED Notes (Signed)
See triage note  Presents with laceration to right hand  States cut her hand with knife at work

## 2021-01-25 ENCOUNTER — Telehealth: Payer: Self-pay | Admitting: Family Medicine

## 2021-01-25 NOTE — Telephone Encounter (Signed)
Patient called wanting to know if she would need some sort of document so that she can travel. Travel time would be around July 10th and be back before the 30th. Would like to know before she buys her tickets .

## 2021-01-25 NOTE — Telephone Encounter (Signed)
Returned call to patient who has new OB appointment on 02/01/2021. Patient is buying plane tickets for a trip to [place], and wants to know if there is paperwork from her health care provider that she needs to take. Patient concerned about security scan risk to baby and counseled she does not need paperwork from [facility], but that she can ask for alternate screening if she is concerned about an effect on the baby. She states she thought it was an xray like you get at the hospital and was counseled that the security scan is different and should not cause her or her baby any harm. Patient states understanding.Jenetta Downer, RN

## 2021-02-01 ENCOUNTER — Other Ambulatory Visit: Payer: Self-pay

## 2021-02-01 ENCOUNTER — Ambulatory Visit: Payer: 59 | Admitting: Physician Assistant

## 2021-02-01 VITALS — BP 102/49 | HR 95 | Temp 99.1°F | Wt 177.6 lb

## 2021-02-01 DIAGNOSIS — Z98891 History of uterine scar from previous surgery: Secondary | ICD-10-CM

## 2021-02-01 DIAGNOSIS — O99211 Obesity complicating pregnancy, first trimester: Secondary | ICD-10-CM

## 2021-02-01 DIAGNOSIS — Z3481 Encounter for supervision of other normal pregnancy, first trimester: Secondary | ICD-10-CM | POA: Diagnosis not present

## 2021-02-01 DIAGNOSIS — O99210 Obesity complicating pregnancy, unspecified trimester: Secondary | ICD-10-CM | POA: Insufficient documentation

## 2021-02-01 DIAGNOSIS — Z3480 Encounter for supervision of other normal pregnancy, unspecified trimester: Secondary | ICD-10-CM | POA: Insufficient documentation

## 2021-02-01 LAB — URINALYSIS
Bilirubin, UA: NEGATIVE
Glucose, UA: NEGATIVE
Ketones, UA: NEGATIVE
Leukocytes,UA: NEGATIVE
Nitrite, UA: NEGATIVE
Protein,UA: NEGATIVE
Specific Gravity, UA: 1.025 (ref 1.005–1.030)
Urobilinogen, Ur: 0.2 mg/dL (ref 0.2–1.0)
pH, UA: 6.5 (ref 5.0–7.5)

## 2021-02-01 LAB — HEMOGLOBIN, FINGERSTICK: Hemoglobin: 11.8 g/dL (ref 11.1–15.9)

## 2021-02-01 MED ORDER — ASPIRIN EC 81 MG PO TBEC: 81.0000 mg | DELAYED_RELEASE_TABLET | Freq: Every day | ORAL | Status: AC

## 2021-02-01 NOTE — Progress Notes (Signed)
Urine drip and hgb reviewed with provider, A. Ronette Deter, PA-C, during clinic visit.   Adalberto Cole, RN

## 2021-02-01 NOTE — Progress Notes (Signed)
[facility] DEPT [facility] [place] [address] [phone]  INITIAL PRENATAL VISIT NOTE  Subjective:  Alexandra Fernandez is a 30 y.o. G2P1001 at [redacted]w[redacted]d being seen today to start prenatal care at the [facility] Department.  She is currently monitored for the following issues for this high-risk pregnancy and has Uterine fibroid; Supervision of other normal pregnancy, antepartum; Obesity affecting pregnancy, antepartum; and History of cesarean delivery on their problem list.  Patient reports  morning nausea and vomiting, fatigue .  Contractions: Not present. Vag. Bleeding: None.  Movement: Absent. Denies leaking of fluid.   Indications for ASA therapy (per uptodate) One of the following: Previous pregnancy with preeclampsia, especially early onset and with an adverse outcome yes Multifetal gestation No Chronic hypertension No Type 1 or 2 diabetes mellitus No Chronic kidney disease No Autoimmune disease (antiphospholipid syndrome, systemic lupus erythematosus) No  Two or more of the following: Nulliparity No Obesity (body mass index >30 kg/m2) Yes Family history of preeclampsia in mother or sister No Age ?35 years No Sociodemographic characteristics (African American race, low socioeconomic level) Yes Personal risk factors (eg, previous pregnancy with low birth weight or small for gestational age infant, previous adverse pregnancy outcome [eg, stillbirth], interval >10 years between pregnancies) No   The following portions of the patient's history were reviewed and updated as appropriate: allergies, current medications, past family history, past medical history, past social history, past surgical history and problem list. Problem list updated.  Objective:   Vitals:   02/01/21 1304  BP: (!) 102/49  Pulse: 95  Temp: 99.1 F (37.3 C)  Weight: 177 lb 9.6 oz (80.6 kg)    Fetal Status: Fetal Heart  Rate (bpm): 148 Fundal Height: 12 cm Movement: Absent  Presentation: Undeterminable   Physical Exam Vitals and nursing note reviewed.  Constitutional:      General: She is not in acute distress.    Appearance: Normal appearance. She is well-developed.  HENT:     Head: Normocephalic and atraumatic.     Right Ear: External ear normal.     Left Ear: External ear normal.     Nose: Nose normal. No congestion or rhinorrhea.     Mouth/Throat:     Lips: Pink.     Mouth: Mucous membranes are moist.     Dentition: Normal dentition. No dental caries.     Pharynx: Oropharynx is clear. Uvula midline.     Comments: Dentition: good repair Eyes:     General: No scleral icterus.    Conjunctiva/sclera: Conjunctivae normal.  Neck:     Thyroid: No thyroid mass or thyromegaly.  Cardiovascular:     Rate and Rhythm: Normal rate.     Pulses: Normal pulses.     Comments: Extremities are warm and well perfused Pulmonary:     Effort: Pulmonary effort is normal.     Breath sounds: Normal breath sounds.  Chest:     Chest wall: No mass.  Breasts:    Tanner Score is 5.     Breasts are symmetrical.     Right: Normal. No mass, nipple discharge, skin change or axillary adenopathy.     Left: Normal. No mass, nipple discharge, skin change or axillary adenopathy.  Abdominal:     General: Abdomen is flat. A surgical scar is present.     Palpations: Abdomen is soft.     Tenderness: There is no abdominal tenderness.     Comments:  Gravid   Genitourinary:    General: Normal vulva.     Exam position: Lithotomy position.     Pubic Area: No rash.      Labia:        Right: No rash.        Left: No rash.      Vagina: Normal. No vaginal discharge.     Cervix: No cervical motion tenderness or friability.     Uterus: Normal. Enlarged (Gravid 12-14 week size). Not tender.      Adnexa: Right adnexa normal and left adnexa normal.     Rectum: Normal. No external hemorrhoid.  Musculoskeletal:     Right lower leg:  No edema.     Left lower leg: No edema.  Lymphadenopathy:     Cervical: No cervical adenopathy.     Upper Body:     Right upper body: No axillary adenopathy.     Left upper body: No axillary adenopathy.  Skin:    General: Skin is warm.     Capillary Refill: Capillary refill takes less than 2 seconds.  Neurological:     Mental Status: She is alert.    Assessment and Plan:  Pregnancy: G2P1001 at [redacted]w[redacted]d  1. Supervision of other normal pregnancy, antepartum Enc po fluids. Pt considering aneuploidy testing, undecided today. Desires work note - written to email to pt.  - HIV-1/HIV-2 Qualitative RNA - Prenatal profile without Varicella/Rubella ([id number]) - HCV Ab w Reflex to Quant PCR - Urine Culture - Chlamydia/GC NAA, Confirmation - Glucose, 1 hour gestational - Hgb A1c w/o eAG - Comprehensive metabolic panel - Protein / creatinine ratio, urine  (Spot) - TSH - Hemoglobin, fingerstick - Urinalysis - IGP, rfx Aptima HPV ASCU - [id number] Drug Screen  2. Obesity affecting pregnancy, antepartum Baseline labs, MNT referral, start daily aspirin at 12 weeks, counseled re: recommended wt gain. - Glucose, 1 hour gestational - Hgb A1c w/o eAG - Comprehensive metabolic panel - Protein / creatinine ratio, urine  (Spot) - TSH - aspirin EC 81 MG tablet; Take 1 tablet (81 mg total) by mouth daily. Swallow whole.  3. History of cesarean delivery Pt interested in TOLAC.    Discussed overview of care and coordination with inpatient delivery practices including [facility], [facility], [facility] and [facility].     Preterm labor symptoms and general obstetric precautions including but not limited to vaginal bleeding, contractions, leaking of fluid and fetal movement were reviewed in detail with the patient.  Please refer to After Visit Summary for other counseling recommendations.   Return in about 4 weeks (around 03/01/2021).  Future Appointments  Date Time Provider [place]   03/02/2021 10:20 AM [facility] PROVIDER AC-[facility] None    Lora Havens, PA-C

## 2021-02-02 LAB — PROTEIN / CREATININE RATIO, URINE
Creatinine, Urine: 89.2 mg/dL
Protein, Ur: 6 mg/dL
Protein/Creat Ratio: 67 mg/g creat (ref 0–200)

## 2021-02-02 LAB — 789231 7+OXYCODONE-BUND
Amphetamines, Urine: NEGATIVE ng/mL
BENZODIAZ UR QL: NEGATIVE ng/mL
Barbiturate screen, urine: NEGATIVE ng/mL
Cannabinoid Quant, Ur: NEGATIVE ng/mL
Cocaine (Metab.): NEGATIVE ng/mL
OPIATE SCREEN URINE: NEGATIVE ng/mL
Oxycodone/Oxymorphone, Urine: NEGATIVE ng/mL
PCP Quant, Ur: NEGATIVE ng/mL

## 2021-02-02 NOTE — Progress Notes (Addendum)
Letter for work emailed to patient 02/02/21. Patient called and states she received e-mail.   Adalberto Cole, RN

## 2021-02-03 LAB — CBC/D/PLT+RPR+RH+ABO+AB SCR
Antibody Screen: NEGATIVE
Basophils Absolute: 0 10*3/uL (ref 0.0–0.2)
Basos: 0 %
EOS (ABSOLUTE): 0 10*3/uL (ref 0.0–0.4)
Eos: 1 %
Hematocrit: 35.4 % (ref 34.0–46.6)
Hemoglobin: 12.1 g/dL (ref 11.1–15.9)
Hepatitis B Surface Ag: NEGATIVE
Immature Grans (Abs): 0 10*3/uL (ref 0.0–0.1)
Immature Granulocytes: 0 %
Lymphocytes Absolute: 1.6 10*3/uL (ref 0.7–3.1)
Lymphs: 23 %
MCH: 30.7 pg (ref 26.6–33.0)
MCHC: 34.2 g/dL (ref 31.5–35.7)
MCV: 90 fL (ref 79–97)
Monocytes Absolute: 0.4 10*3/uL (ref 0.1–0.9)
Monocytes: 5 %
Neutrophils Absolute: 5.2 10*3/uL (ref 1.4–7.0)
Neutrophils: 71 %
Platelets: 260 10*3/uL (ref 150–450)
RBC: 3.94 x10E6/uL (ref 3.77–5.28)
RDW: 12.8 % (ref 11.7–15.4)
RPR Ser Ql: NONREACTIVE
Rh Factor: POSITIVE
WBC: 7.3 10*3/uL (ref 3.4–10.8)

## 2021-02-03 LAB — GLUCOSE, 1 HOUR GESTATIONAL: Gestational Diabetes Screen: 72 mg/dL (ref 65–139)

## 2021-02-03 LAB — COMPREHENSIVE METABOLIC PANEL
ALT: 11 IU/L (ref 0–32)
AST: 6 IU/L (ref 0–40)
Albumin/Globulin Ratio: 1.5 (ref 1.2–2.2)
Albumin: 3.9 g/dL (ref 3.9–5.0)
Alkaline Phosphatase: 68 IU/L (ref 44–121)
BUN/Creatinine Ratio: 15 (ref 9–23)
BUN: 7 mg/dL (ref 6–20)
Bilirubin Total: 0.2 mg/dL (ref 0.0–1.2)
CO2: 23 mmol/L (ref 20–29)
Calcium: 9.3 mg/dL (ref 8.7–10.2)
Chloride: 101 mmol/L (ref 96–106)
Creatinine, Ser: 0.47 mg/dL — ABNORMAL LOW (ref 0.57–1.00)
Globulin, Total: 2.6 g/dL (ref 1.5–4.5)
Glucose: 77 mg/dL (ref 65–99)
Potassium: 3.8 mmol/L (ref 3.5–5.2)
Sodium: 138 mmol/L (ref 134–144)
Total Protein: 6.5 g/dL (ref 6.0–8.5)
eGFR: 132 mL/min/{1.73_m2} (ref >59)

## 2021-02-03 LAB — TSH: TSH: 0.084 u[IU]/mL — ABNORMAL LOW (ref 0.450–4.500)

## 2021-02-03 LAB — URINE CULTURE

## 2021-02-03 LAB — HGB A1C W/O EAG: Hgb A1c MFr Bld: 5.5 % (ref 4.8–5.6)

## 2021-02-03 LAB — HIV-1/HIV-2 QUALITATIVE RNA
HIV-1 RNA, Qualitative: NONREACTIVE
HIV-2 RNA, Qualitative: NONREACTIVE

## 2021-02-03 LAB — HCV AB W REFLEX TO QUANT PCR: HCV Ab: <0.1 s/co ratio (ref 0.0–0.9)

## 2021-02-03 LAB — HCV INTERPRETATION

## 2021-02-04 LAB — CHLAMYDIA/GC NAA, CONFIRMATION
Chlamydia trachomatis, NAA: NEGATIVE
Neisseria gonorrhoeae, NAA: NEGATIVE

## 2021-02-06 LAB — IGP, RFX APTIMA HPV ASCU: PAP Smear Comment: 0

## 2021-02-07 ENCOUNTER — Encounter: Payer: Self-pay | Admitting: Advanced Practice Midwife

## 2021-02-07 DIAGNOSIS — R799 Abnormal finding of blood chemistry, unspecified: Secondary | ICD-10-CM | POA: Insufficient documentation

## 2021-02-08 NOTE — Progress Notes (Signed)
TC to [facility], free T4 and T3 added to 02/01/2021 blood sample.Jenetta Downer, RN

## 2021-02-09 ENCOUNTER — Telehealth: Payer: Self-pay | Admitting: Family Medicine

## 2021-02-09 ENCOUNTER — Encounter: Payer: Self-pay | Admitting: Advanced Practice Midwife

## 2021-02-09 LAB — T4, FREE: Free T4: 1.46 ng/dL (ref 0.82–1.77)

## 2021-02-09 LAB — SPECIMEN STATUS REPORT

## 2021-02-09 LAB — T3: T3, Total: 211 ng/dL — ABNORMAL HIGH (ref 71–180)

## 2021-02-09 NOTE — Telephone Encounter (Signed)
Provider Hilario Quarry, FNP asked to forward this information to E. Sciora, CNM.   Adalberto Cole, RN

## 2021-02-09 NOTE — Telephone Encounter (Signed)
Patient called to speak with either Annamarie or Estill Bamberg about filing an accommodations claim with her employer.

## 2021-02-09 NOTE — Progress Notes (Signed)
[facility] referral (abnormal thyroid labs values) with snapshot pages and records faxed with confirmation received.[facility] scheduler will contact client via phone with appt. Rich Number, RN

## 2021-02-09 NOTE — Telephone Encounter (Signed)
Communicated with patient today and she states that her employer is unable to follow the letter of accommodations provided by provider, A. Streilein, PA-C. She states they her employer advices her to make a claim with [employer] for medical accommodation and leave.   Unsure what exactly we can do to help with patient. Will forward this message to provider in clinic today.   Adalberto Cole, RN

## 2021-02-13 ENCOUNTER — Telehealth: Payer: Self-pay

## 2021-02-13 NOTE — Telephone Encounter (Signed)
Patient called requesting to speak with [facility].   Returned phone call to patient today.   Patient was calling us to f/u with the phone call she received from [facility] regarding [place] consult. Patient was worried because they mentioned her pregnancy was high risk and she was confused why. Explained to patient that all I saw she has was low thyroid and that was the reason for consult.   Patient also states she will call us when she can drop off work [employer] paperwork.   Adalberto Cole, RN

## 2021-02-20 NOTE — Addendum Note (Signed)
Addended by: Cletis Media on: 02/20/2021 09:19 AM   Modules accepted: Orders

## 2021-02-27 NOTE — Progress Notes (Signed)
Previous note regarding work restrictions for client not accepted by employer. Disability forms brought to clinic by clerical today as received via fax. Forms, along with copy of previously written letter placed in [facility] provider workroom to do box (where referrals, [place], etc placed). Rich Number, RN

## 2021-03-02 ENCOUNTER — Ambulatory Visit: Payer: 59

## 2021-03-09 ENCOUNTER — Other Ambulatory Visit: Payer: Self-pay

## 2021-03-09 ENCOUNTER — Ambulatory Visit: Payer: 59 | Admitting: Family Medicine

## 2021-03-09 VITALS — BP 104/60 | HR 93 | Temp 98.0°F | Wt 177.0 lb

## 2021-03-09 DIAGNOSIS — O99280 Endocrine, nutritional and metabolic diseases complicating pregnancy, unspecified trimester: Secondary | ICD-10-CM

## 2021-03-09 DIAGNOSIS — E0590 Thyrotoxicosis, unspecified without thyrotoxic crisis or storm: Secondary | ICD-10-CM

## 2021-03-09 DIAGNOSIS — O09899 Supervision of other high risk pregnancies, unspecified trimester: Secondary | ICD-10-CM

## 2021-03-09 NOTE — Progress Notes (Addendum)
Patient here for MH RV at 38 5/7. Not yet taking ASA, needs more education. Had [facility] consult yesterday, and was started on Methimazole BID, '5mg'$ . States she has follow-up on 03/23/2021. States she traveled to [place] for 2 weeks and returned on 03/03/2021. Has questions about FMLA paperwork.. Desires [place] screen today. Needs [facility] U/S for anatomy.Marland KitchenMarland KitchenJenetta Downer, RN

## 2021-03-09 NOTE — Progress Notes (Signed)
   PRENATAL VISIT NOTE  Subjective:  Jayden Iyanuoluwa Roiger is a 30 y.o. G2P1001 at 8w5dbeing seen today for ongoing prenatal care.  She is currently monitored for the following issues for this high-risk pregnancy and has Uterine fibroid; Supervision of other normal pregnancy, antepartum; Obesity affecting pregnancy, antepartum; History of cesarean delivery; and Abnormal blood finding low TSH 02/01/21 on their problem list.  Patient reports no complaints.  Contractions: Not present. Vag. Bleeding: None.  Movement: Present. Denies leaking of fluid/ROM.   The following portions of the patient's history were reviewed and updated as appropriate: allergies, current medications, past family history, past medical history, past social history, past surgical history and problem list. Problem list updated.  Objective:   Vitals:   03/09/21 1051  BP: 104/60  Pulse: 93  Temp: 98 F (36.7 C)  Weight: 177 lb (80.3 kg)    Fetal Status: Fetal Heart Rate (bpm): 147 Fundal Height: 15 cm Movement: Present     General:  Alert, oriented and cooperative. Patient is in no acute distress.  Skin: Skin is warm and dry. No rash noted.   Cardiovascular: Normal heart rate noted  Respiratory: Normal respiratory effort, no problems with respiration noted  Abdomen: Soft, gravid, appropriate for gestational age.  Pain/Pressure: Absent     Pelvic: Cervical exam deferred        Extremities: Normal range of motion.  Edema: None  Mental Status: Normal mood and affect. Normal behavior. Normal judgment and thought content.   Assessment and Plan:  Pregnancy: G2P1001 at 196w5d1. Supervision of other normal pregnancy, antepartum - Pt taking PNV as directed.   -pt not taking ASA as directed.  Patient instructed to start taking ASA today and discussed the importance of ASA with her hx of preeclampsia.   - FMLA paper work completed and returned to patient.   -discussed exercise and diet.   - AFP TETRA  2.  Hyperthyroidism affecting pregnancy, antepartum  [facility]  Started on Methimazole 5 mg BID. Pt started RX on 84/22.  F/u appt on 03/23/21 @ 1:30 pm .  Discussed @ 20 weeks pt to be seen Q2 weeks visits. Will inform patient if need to transfer to higher level of care.     Pt has follow up appt. Scheduled for 03/23/21.     Preterm labor symptoms and general obstetric precautions including but not limited to vaginal bleeding, contractions, leaking of fluid and fetal movement were reviewed in detail with the patient. Please refer to After Visit Summary for other counseling recommendations.  Return in about 4 weeks (around 04/06/2021) for routine prenatal care.  Future Appointments  Date Time Provider [place]9/09/2020 11:00 AM AC-MH PROVIDER AC-MAT None    MeJunious DresserFNP

## 2021-03-11 LAB — AFP TETRA
DIA Mom Value: 1.11
DIA Value (EIA): 153.64 pg/mL
DSR (By Age)    1 IN: 673
DSR (Second Trimester) 1 IN: 1863
Gestational Age: 16.5 WEEKS
MSAFP Mom: 0.79
MSAFP: 26.6 ng/mL
MSHCG Mom: 1.53
MSHCG: 50685 m[IU]/mL
Maternal Age At EDD: 30.3 yr
Osb Risk: 10000
T18 (By Age): 1:2623 {titer}
Test Results:: NEGATIVE
Weight: 177 [lb_av]
uE3 Mom: 1.12
uE3 Value: 1.19 ng/mL

## 2021-03-26 ENCOUNTER — Telehealth: Payer: Self-pay | Admitting: Family Medicine

## 2021-03-26 NOTE — Telephone Encounter (Signed)
Return call to client and notified her paperwork is in provider office waiting on completion. Counseled that last office visit notes and next appt are witih form as requested and provider to complete one other portion of form. Counseled that wi;ll ask provider first thing in am to complete form ASAP. Rich Number, RN

## 2021-03-26 NOTE — Telephone Encounter (Signed)
Patient left some paperwork to be completed by the provider in regards to her Medical Leave. Please give her a call back to let her know of the status of those documents. Thanks :) Attention: Lanney Gins

## 2021-04-06 ENCOUNTER — Ambulatory Visit: Payer: 59 | Admitting: Advanced Practice Midwife

## 2021-04-06 ENCOUNTER — Encounter: Payer: Self-pay | Admitting: Advanced Practice Midwife

## 2021-04-06 ENCOUNTER — Other Ambulatory Visit: Payer: Self-pay

## 2021-04-06 DIAGNOSIS — E0590 Thyrotoxicosis, unspecified without thyrotoxic crisis or storm: Secondary | ICD-10-CM

## 2021-04-06 DIAGNOSIS — Z3480 Encounter for supervision of other normal pregnancy, unspecified trimester: Secondary | ICD-10-CM

## 2021-04-06 DIAGNOSIS — Z8679 Personal history of other diseases of the circulatory system: Secondary | ICD-10-CM

## 2021-04-06 DIAGNOSIS — O99212 Obesity complicating pregnancy, second trimester: Secondary | ICD-10-CM

## 2021-04-06 DIAGNOSIS — Z3482 Encounter for supervision of other normal pregnancy, second trimester: Secondary | ICD-10-CM

## 2021-04-06 DIAGNOSIS — Z98891 History of uterine scar from previous surgery: Secondary | ICD-10-CM

## 2021-04-06 DIAGNOSIS — O99210 Obesity complicating pregnancy, unspecified trimester: Secondary | ICD-10-CM

## 2021-04-06 DIAGNOSIS — G44229 Chronic tension-type headache, not intractable: Secondary | ICD-10-CM | POA: Insufficient documentation

## 2021-04-06 HISTORY — DX: Thyrotoxicosis, unspecified without thyrotoxic crisis or storm: E05.90

## 2021-04-06 NOTE — Progress Notes (Signed)
Patient here for MH RV at 20 5/7. Needs appointment q 2 weeks and has [facility] consult and U/S today at 3:00 and 4:00.Jenetta Downer, RN

## 2021-04-06 NOTE — Progress Notes (Signed)
   PRENATAL VISIT NOTE  Subjective:  Alexandra Fernandez is a 30 y.o. G2P1001 at 55w5dbeing seen today for ongoing prenatal care.  She is currently monitored for the following issues for this high-risk pregnancy and has Uterine fibroid; Supervision of other normal pregnancy, antepartum; Obesity affecting pregnancy, antepartum; History of cesarean delivery LTCS 10/14/17 for +CST and category 2 FHR tracing; Abnormal blood finding low TSH 02/01/21; Hyperthyroidism 02/01/21; and History of elevated blood pressure during last pregnancy 158/100 on 09/28/17 and 134/94 on admission on their problem list.  Patient reports fatigue, headache, and tachycardia .  Contractions: Not present. Vag. Bleeding: None.  Movement: Present.  Denies leaking of fluid/ROM.   The following portions of the patient's history were reviewed and updated as appropriate: allergies, current medications, past family history, past medical history, past social history, past surgical history and problem list. Problem list updated.  Objective:   Vitals:   04/06/21 1118  BP: 100/63  Pulse: 95  Temp: 97.7 F (36.5 C)  Weight: 182 lb 6.4 oz (82.7 kg)    Fetal Status: Fetal Heart Rate (bpm): 140 Fundal Height: 19 cm Movement: Present     General:  Alert, oriented and cooperative. Patient is in no acute distress.  Skin: Skin is warm and dry. No rash noted.   Cardiovascular: Normal heart rate noted  Respiratory: Normal respiratory effort, no problems with respiration noted  Abdomen: Soft, gravid, appropriate for gestational age.  Pain/Pressure: Absent     Pelvic: Cervical exam deferred        Extremities: Normal range of motion.  Edema: None  Mental Status: Normal mood and affect. Normal behavior. Normal judgment and thought content.   Assessment and Plan:  Pregnancy: G2P1001 at 270w5d1. History of cesarean delivery LTCS 10/14/17 due to category 2 FHR tracing and +CST Will need VBAC calculator done (pt desires TOLAC)  2.  Hyperthyroidism 02/01/21 [facility] consult on 03/08/21 with initiation of Methimazole 5 mg BID on that date and repeat [facility] and repeat labs Pt states she is fatigued, difficulty falling asleep, tachy, h/a Pt states no u/s was done [facility] check weeks 20-24 with repeat free T4 and TSH q 2-4 wks and monthly u/s Needs free T4 and TSH at 6 wk pp   3. Obesity affecting pregnancy, antepartum 6.4 oz (0.181 kg) Taking ASA 81 mg daily  4. Supervision of other normal pregnancy, antepartum 1 hour glucola on 02/01/21=72 Not working Suggestions given for h/a, fatigue Pt states no u/s done 03/23/21 and hasn't had an u/s this pregnancy yet--[facility]  5. History of  preeclampsia  with last pregnancy 158/100 on 09/28/17 and 134/94 on L&D admission Review of records of last pregnancy with ER BP during pregnancy on 09/28/17=158/100 and on L&D admission 134/94. Pt states [facility] did some tests and called her and told her she had proteinuria and to go to the hospital now   Preterm labor symptoms and general obstetric precautions including but not limited to vaginal bleeding, contractions, leaking of fluid and fetal movement were reviewed in detail with the patient. Please refer to After Visit Summary for other counseling recommendations.  Return in about 2 weeks (around 04/20/2021) for routine PNC.  Future Appointments  Date Time Provider [place]9/16/2022 11:00 AM AC-[facility] PROVIDER AC-MAT None    ElHerbie SaxonCNM

## 2021-04-06 NOTE — Telephone Encounter (Signed)
Patient in clinic today, see notes from 04/06/2021 visit .Marland KitchenJenetta Downer, RN

## 2021-04-19 DIAGNOSIS — Z8759 Personal history of other complications of pregnancy, childbirth and the puerperium: Secondary | ICD-10-CM | POA: Insufficient documentation

## 2021-04-20 ENCOUNTER — Ambulatory Visit: Payer: 59

## 2021-09-10 DIAGNOSIS — E079 Disorder of thyroid, unspecified: Secondary | ICD-10-CM | POA: Insufficient documentation

## 2021-10-11 ENCOUNTER — Encounter: Payer: Self-pay | Admitting: Physical Therapy

## 2021-10-11 ENCOUNTER — Other Ambulatory Visit: Payer: Self-pay

## 2021-10-11 ENCOUNTER — Ambulatory Visit: Payer: Medicaid Other | Attending: Nurse Practitioner | Admitting: Physical Therapy

## 2021-10-11 DIAGNOSIS — R102 Pelvic and perineal pain: Secondary | ICD-10-CM

## 2021-10-11 DIAGNOSIS — R278 Other lack of coordination: Secondary | ICD-10-CM

## 2021-10-11 DIAGNOSIS — M6281 Muscle weakness (generalized): Secondary | ICD-10-CM | POA: Diagnosis present

## 2021-10-11 NOTE — Therapy (Signed)
[facility] [address] [place], [place], [address] Phone: [phone]   Fax:  [phone]  Physical Therapy Evaluation  Patient Details  Name: Alexandra Fernandez MRN: [id number] Date of Birth: 1991/06/05 Referring Provider (PT): Huprich   Encounter Date: 10/11/2021   PT End of Session - 10/11/21 1654     Visit Number 1    Number of Visits 12    Date for PT Re-Evaluation 01/03/22    Authorization Type IE 10/11/2021    PT Start Time 1200    PT Stop Time 1240    PT Time Calculation (min) 40 min    Activity Tolerance Patient tolerated treatment well    Behavior During Therapy [facility] for tasks assessed/performed             Past Medical History:  Diagnosis Date   Abnormal glucose affecting pregnancy 08/14/2017   Acid reflux    Anxiety and depression    Broken arm    AGe 31; broke left armc when thrown from horse   Colitis    Hyperthyroidism 04/06/2021   Preeclampsia, unspecified trimester 10/14/2017   Uterine fibroid 10/28/2017    Past Surgical History:  Procedure Laterality Date   CESAREAN SECTION     LAPAROSCOPIC APPENDECTOMY N/A 02/20/2016   Procedure: APPENDECTOMY LAPAROSCOPIC;  Surgeon: Lattie Haw, MD;  Location: [facility] ORS;  Service: General;  Laterality: N/A;    There were no vitals filed for this visit.        [facility] PT Assessment - 10/11/21 1159       Assessment   Medical Diagnosis SUI    Referring Provider (PT) Huprich    Onset Date/Surgical Date 08/15/21    Hand Dominance Right    Prior Therapy None for this dx      Balance Screen   Has the patient fallen in the past 6 months No            PELVIC HEALTH PHYSICAL THERAPY EVALUATION  SCREENING Red Flags: None Have you had any night sweats? Unexplained weight loss? Saddle anesthesia? Unexplained changes in bowel or bladder habits?  Precautions: None  SUBJECTIVE  Chief Complaint: Patient notes she has dealt with incontinence  since first pregnancy but it is occasional. Patient notes with most recent pregnancy she had UI with every sneeze and cough. Patient notes with catheter removal  she had intense pain and few days after childbirth she had complete loss of bladder with sneezing and urge. Patient also notes increase soreness/tenderness internally behind the clitoris.   Pertinent History:  Falls Negative.  Scoliosis Negative. Pulmonary disease/dysfunction Negative. Surgical history: Positive for see above.   Recent Procedures/Tests/Findings: n/a  Obstetrical History: G2P2 Deliveries: c-section, VBAC Tearing/Episiotomy: 2nd degree  Gynecological History: Hysterectomy: No Endometriosis: Negative Pelvic Organ Prolapse: Negative Pain with exam: No Heaviness/pressure: No    Urinary History: Incontinence: Positive. Onset: 4 years ago, worsened with recent  Triggers: sneezing, coughing. Amount: Min/Mod/Complete Loss.  Protective undergarments: Yes  Type: pantyliner  Number used/day: 2x Fluid Intake: 4x 16-20 oz H20, 2 cups coffee caffeinated, 2x juices/sodas Nocturia: 2+x/night Frequency of urination: every 2 hours Pain with urination: Negative Difficulty initiating urination: Negative Intermittent stream: Positive for sometimes Frequent UTI: Negative.   Gastrointestinal History: Patient denies any concerns.  Pain with defecation: Negative Straining with defecation: Negative  Sexual activity/pain: Pain with intercourse: Positive for numbness over scar.   Initial penetration: Yes  Deep thrusting: No External stimulation: No Change in ability to  achieve orgasm: No  Location of pain: vaginal Current pain:  0/10  Max pain:  4-5/10 with penetration Least pain:  0/10 Pain quality: pain quality: numbness and sharp Radiating pain: No   Patient Goals:  Stop leaking; have pain free sex   OBJECTIVE  Mental Status Patient is oriented to person, place and time.  Recent memory is intact.  Remote  memory is intact.  Attention span and concentration are intact.  Expressive speech is intact.  Patient's fund of knowledge is within normal limits for educational level.  POSTURE/OBSERVATIONS:  Lumbar lordosis: WNL Thoracic kyphosis: WNL Iliac crest height: appearing equal bilaterally Lumbar lateral shift: negative Pelvic obliquity: appearing negative  GAIT: Grossly WNL  RANGE OF MOTION: deferred 2/2 to time constraints   LEFT RIGHT  Lumbar forward flexion (65):      Lumbar extension (30):     Lumbar lateral flexion (25):     Thoracic and Lumbar rotation (30 degrees):       Hip Flexion (0-125):      Hip IR (0-45):     Hip ER (0-45):     Hip Abduction (0-40):     Hip extension (0-15):       STRENGTH: MMT deferred 2/2 to time constraints  RLE LLE  Hip Flexion    Hip Extension    Hip Abduction     Hip Adduction     Hip ER     Hip IR     Knee Extension    Knee Flexion    Dorsiflexion     Plantarflexion (seated)     ABDOMINAL: deferred 2/2 to time constraints Palpation: Diastasis: Scar mobility: Rib flare:  SPECIAL TESTS: deferred 2/2 to time constraints   PHYSICAL PERFORMANCE MEASURES: STS: WNL  EXTERNAL PELVIC EXAM: deferred 2/2 to time constraints Palpation: Breath coordination: Voluntary Contraction: present/absent Relaxation: full/delayed/non-relaxing Perineal movement with sustained IAP increase ("bear down"): descent/no change/elevation/excessive descent Perineal movement with rapid IAP increase ("cough"): elevation/no change/descent  INTERNAL VAGINAL EXAM: deferred 2/2 to time constraints Introitus Appears:  Skin integrity:  Scar mobility: Strength (PERF):  Symmetry: Palpation: Prolapse:   INTERNAL RECTAL EXAM: not indicated Strength (PERF): Symmetry: Palpation: Prolapse:   OUTCOME MEASURES: FOTO (Urinary Problem 53)   ASSESSMENT Patient is a 31 year old presenting to clinic with chief complaints of SUI and pain with sexual  activity. Today's evaluation is suggestive of deficits in PFM strength, PFM coordination, IAP management, and pain as evidenced by 5/10 pain with penetration, UI with sneezing and coughing of varying amounts, and frequent urge to void. Patient's responses on FOTO Urinary outcome measures (53) indicate significant functional limitations/disability/distress. Patient's progress may be limited due to demands of caregiving; however, patient's motivation is advantageous. . Patient will benefit from continued skilled therapeutic intervention to address deficits in PFM strength, PFM coordination, IAP management, and pain in order to increase function and improve overall QOL.  EDUCATION Patient educated on prognosis, POC, and provided with HEP including: not initiated. Patient articulated understanding and returned demonstration. Patient will benefit from further education in order to maximize compliance and understanding for long-term therapeutic gains.     Objective measurements completed on examination: See above findings.         PT Long Term Goals - 10/11/21 1700       PT LONG TERM GOAL #1   Title Patient will demonstrate independence with HEP in order to maximize therapeutic gains and improve carryover from physical therapy sessions to ADLs in the home and community.  Baseline IE: not initiated    Time 12    Period Weeks    Status New    Target Date 01/03/22      PT LONG TERM GOAL #2   Title Patient will demonstrate circumferential and sequential contraction of >4/5 MMT, > 6 sec hold x10 and 5 consecutive quick flicks with </= 10 min rest between testing bouts, and relaxation of the PFM coordinated with breath for improved management of intra-abdominal pressure and normal bowel and bladder function without the presence of pain nor incontinence in order to improve participation at home and in the community.    Baseline IE: assess ast next visit    Time 12    Period Weeks    Status New     Target Date 01/03/22      PT LONG TERM GOAL #3   Title Patient will decrease worst pain as reported on NPRS by at least 2 points to demonstrate clinically significant reduction in pain in order to restore/improve function and overall QOL.    Baseline IE: 5/10    Time 12    Period Weeks    Status New    Target Date 01/03/22      PT LONG TERM GOAL #4   Title Patient will demonstrate improved function as evidenced by a score of 60 on FOTO measure for full participation in activities at home and in the community.    Baseline IE: 53    Time 12    Period Weeks    Status New    Target Date 01/03/22                    Plan - 10/11/21 1654     Clinical Impression Statement Patient is a 31 year old presenting to clinic with chief complaints of SUI and pain with sexual activity. Today's evaluation is suggestive of deficits in PFM strength, PFM coordination, IAP management, and pain as evidenced by 5/10 pain with penetration, UI with sneezing and coughing of varying amounts, and frequent urge to void. Patient's responses on FOTO Urinary outcome measures (53) indicate significant functional limitations/disability/distress. Patient's progress may be limited due to demands of caregiving; however, patient's motivation is advantageous. . Patient will benefit from continued skilled therapeutic intervention to address deficits in PFM strength, PFM coordination, IAP management, and pain in order to increase function and improve overall QOL.    Personal Factors and Comorbidities Comorbidity 3+;Behavior Pattern;Past/Current Experience    Comorbidities anxiety and depression, hyperthyroidism, colitis, acid reflux    Examination-Activity Limitations Continence;Squat;Sleep;Lift;Transfers    Examination-Participation Restrictions Interpersonal Relationship;Cleaning;Community Activity;Shop    Stability/Clinical Decision Making Evolving/Moderate complexity    Clinical Decision Making Moderate    Rehab  Potential Good    PT Frequency 1x / week    PT Duration 12 weeks    PT Treatment/Interventions ADLs/Self Care Home Management;Biofeedback;Cryotherapy;Electrical Stimulation;Moist Heat;Therapeutic activities;Therapeutic exercise;Neuromuscular re-education;Patient/family education;Orthotic Fit/Training;Manual techniques;Scar mobilization;Dry needling;Taping;Joint Manipulations;Spinal Manipulations    PT Next Visit Plan physical assessment    PT Home Exercise Plan not initiated    Consulted and Agree with Plan of Care Patient             Patient will benefit from skilled therapeutic intervention in order to improve the following deficits and impairments:  Decreased activity tolerance, Decreased strength, Decreased coordination, Pain, Improper body mechanics, Decreased scar mobility, Decreased endurance  Visit Diagnosis: Other lack of coordination  Muscle weakness (generalized)  Pelvic pain     Problem List Patient  Active Problem List   Diagnosis Date Noted   Hyperthyroidism 02/01/21 04/06/2021   History of preeclampsia in last pregnancy 158/100 on 09/28/17 and 134/94 on admission 04/06/2021   Abnormal blood finding low TSH 02/01/21 02/07/2021   Supervision of other normal pregnancy, antepartum 02/01/2021   Obesity affecting pregnancy, antepartum BMI=30.3 02/01/2021   History of cesarean delivery LTCS 10/14/17 for +CST and category 2 FHR tracing 02/01/2021   Uterine fibroid 10/28/2017    Sheria Lang PT, DPT [phone]  10/11/2021, 5:03 PM  [facility] [address] [place], [place], [address] Phone: [phone]   Fax:  [phone]  Name: Alexandra Fernandez MRN: [id number] Date of Birth: 11/03/1990

## 2021-10-18 ENCOUNTER — Ambulatory Visit: Payer: Medicaid Other | Admitting: Physical Therapy

## 2021-10-18 ENCOUNTER — Other Ambulatory Visit: Payer: Self-pay

## 2021-10-18 ENCOUNTER — Encounter: Payer: Self-pay | Admitting: Physical Therapy

## 2021-10-18 DIAGNOSIS — R278 Other lack of coordination: Secondary | ICD-10-CM | POA: Diagnosis not present

## 2021-10-18 NOTE — Therapy (Signed)
[place] [facility] ?[address]. [place], [place], [address] ?Phone: [phone]   Fax:  [phone] ? ?Physical Therapy Treatment ? ?Patient Details  ?Name: Alexandra Fernandez ?MRN: [id number] ?Date of Birth: [birth year] ?Referring Provider (PT): Huprich ? ? ?Encounter Date: 10/18/2021 ? ? PT End of Session - 10/18/21 1204   ? ? Visit Number 2   ? Number of Visits 12   ? Date for PT Re-Evaluation 01/03/22   ? Authorization Type IE 10/11/2021   ? PT Start Time 1200   ? PT Stop Time 1240   ? PT Time Calculation (min) 40 min   ? Activity Tolerance Patient tolerated treatment well   ? Behavior During Therapy [facility] for tasks assessed/performed   ? ?  ?  ? ?  ? ? ?Past Medical History:  ?Diagnosis Date  ? Abnormal glucose affecting pregnancy 08/14/2017  ? Acid reflux   ? Anxiety and depression   ? Broken arm   ? AGe 63; broke left armc when thrown from horse  ? Colitis   ? Hyperthyroidism 04/06/2021  ? Preeclampsia, unspecified trimester 10/14/2017  ? Uterine fibroid 10/28/2017  ? ? ?Past Surgical History:  ?Procedure Laterality Date  ? CESAREAN SECTION    ? LAPAROSCOPIC APPENDECTOMY N/A 02/20/2016  ? Procedure: APPENDECTOMY LAPAROSCOPIC;  Surgeon: Florene Glen, MD;  Location: [facility] ORS;  Service: General;  Laterality: N/A;  ? ? ?There were no vitals filed for this visit. ? ? Subjective Assessment - 10/18/21 1202   ? ? Subjective Patient notes that she went to trampoline park for son's birthday and she used pantyliners. Patient was unable to jump without leaking. Patient emptied bladder prior to jumping but still had leakage.   ? Currently in Pain? No/denies   ? ?  ?  ? ?  ? ?TREATMENT ? ?Pre-treatment assessment: L IC elevated, no apparent pelvic obliquity ? ?STRENGTH: MMT  ? RLE LLE  ?Hip Flexion 5 5  ?Hip Extension 4 4  ?Hip Abduction  5 5  ?Hip Adduction  4 4  ?Hip ER  5 5  ?Hip IR  5 5  ?Knee Extension 5 5  ?Knee Flexion 5 5  ? ?ABDOMINAL:  ?Palpation: TTP  suprapubic ?Diastasis: 3 finger width at umbilicus, 2.5 finger width superior, 2 finger width inferior ?Rib flare: 86 degrees ? ?SPECIAL TESTS: ?SLR (SN 92, -LR 0.29): R: Negative L:  Negative ?FABER (SN 81): R: Negative L: Negative ?FADIR (SN 94): R: Negative L: Positive ? ?EXTERNAL PELVIC EXAM: Patient educated on the purpose of the pelvic exam and articulated understanding; patient consented to the exam verbally. ?Palpation: ?Breath coordination: present ?Cued Lengthen: no perineal movement ?Cued Contraction: 3/5, Z66 quick flicks ?Cued Relaxation: full ?Cough: perineal ascent ? ?Manual Therapy: ? ? ?Neuromuscular Re-education: ?Supine hooklying diaphragmatic breathing with VCs and TCs for downregulation of the nervous system and improved management of IAP ?Supine hooklying, PFM contractions with exhalation. VCs and TCs to decrease compensatory patterns and encourage activation of the PFM. ?Shoulder bridge with PFM contraction. VCs and TCs to decrease compensatory patterns and minimize aggravation of the lumbar paraspinals. ?Patient educated briefly on the "knack" for improved continence with transfers. ? ?Therapeutic Exercise: ? ? ?Treatments unbilled: ? ?Post-treatment assessment: ? ?Patient educated throughout session on appropriate technique and form using multi-modal cueing, HEP, and activity modification. Patient articulated understanding and returned demonstration. ? ?Patient Response to interventions: ? ? ?ASSESSMENT ?Patient presents to clinic with excellent motivation to participate in  therapy. Patient demonstrates deficits in PFM strength, PFM coordination, IAP management, and pain. Patient able to achieve both squeeze and lift components of PFM contraction for 10 sequential repetitions during today's session and responded positively to active interventions. Patient will benefit from continued skilled therapeutic intervention to address remaining deficits in PFM strength, PFM coordination, IAP  management, and pain in order to increase function and improve overall QOL. ? ? ? PT Long Term Goals - 10/11/21 1700   ? ?  ? PT LONG TERM GOAL #1  ? Title Patient will demonstrate independence with HEP in order to maximize therapeutic gains and improve carryover from physical therapy sessions to ADLs in the home and community.   ? Baseline IE: not initiated   ? Time 12   ? Period Weeks   ? Status New   ? Target Date 01/03/22   ?  ? PT LONG TERM GOAL #2  ? Title Patient will demonstrate circumferential and sequential contraction of >4/5 MMT, > 6 sec hold x10 and 5 consecutive quick flicks with </= 10 min rest between testing bouts, and relaxation of the PFM coordinated with breath for improved management of intra-abdominal pressure and normal bowel and bladder function without the presence of pain nor incontinence in order to improve participation at home and in the community.   ? Baseline IE: assess ast next visit   ? Time 12   ? Period Weeks   ? Status New   ? Target Date 01/03/22   ?  ? PT LONG TERM GOAL #3  ? Title Patient will decrease worst pain as reported on NPRS by at least 2 points to demonstrate clinically significant reduction in pain in order to restore/improve function and overall QOL.   ? Baseline IE: 5/10   ? Time 12   ? Period Weeks   ? Status New   ? Target Date 01/03/22   ?  ? PT LONG TERM GOAL #4  ? Title Patient will demonstrate improved function as evidenced by a score of 60 on FOTO measure for full participation in activities at home and in the community.   ? Baseline IE: 51   ? Time 12   ? Period Weeks   ? Status New   ? Target Date 01/03/22   ? ?  ?  ? ?  ? ? ? ? ? ? ? ? Plan - 10/18/21 1205   ? ? Clinical Impression Statement Patient presents to clinic with excellent motivation to participate in therapy. Patient demonstrates deficits in PFM strength, PFM coordination, IAP management, and pain. Patient able to achieve both squeeze and lift components of PFM contraction for 10 sequential  repetitions during today's session and responded positively to active interventions. Patient will benefit from continued skilled therapeutic intervention to address remaining deficits in PFM strength, PFM coordination, IAP management, and pain in order to increase function and improve overall QOL.   ? Personal Factors and Comorbidities Comorbidity 3+;Behavior Pattern;Past/Current Experience   ? Comorbidities anxiety and depression, hyperthyroidism, colitis, acid reflux   ? Examination-Activity Limitations Continence;Squat;Sleep;Lift;Transfers   ? Examination-Participation Restrictions Interpersonal Relationship;Cleaning;Community Activity;Shop   ? Stability/Clinical Decision Making Evolving/Moderate complexity   ? Rehab Potential Good   ? PT Frequency 1x / week   ? PT Duration 12 weeks   ? PT Treatment/Interventions ADLs/Self Care Home Management;Biofeedback;Cryotherapy;Electrical Stimulation;Moist Heat;Therapeutic activities;Therapeutic exercise;Neuromuscular re-education;Patient/family education;Orthotic Fit/Training;Manual techniques;Scar mobilization;Dry needling;Taping;Joint Manipulations;Spinal Manipulations   ? PT Next Visit Plan deep core   ? PT Home Exercise  Plan [id number]   ? Consulted and Agree with Plan of Care Patient   ? ?  ?  ? ?  ? ? ?Patient will benefit from skilled therapeutic intervention in order to improve the following deficits and impairments:  Decreased activity tolerance, Decreased strength, Decreased coordination, Pain, Improper body mechanics, Decreased scar mobility, Decreased endurance ? ?Visit Diagnosis: ?Muscle weakness (generalized) ? ?Other lack of coordination ? ?Pelvic pain ? ? ? ? ?Problem List ?Patient Active Problem List  ? Diagnosis Date Noted  ? Hyperthyroidism 02/01/21 04/06/2021  ? History of preeclampsia in last pregnancy 158/100 on 09/28/17 and 134/94 on admission 04/06/2021  ? Abnormal blood finding low TSH 02/01/21 02/07/2021  ? Supervision of other normal pregnancy,  antepartum 02/01/2021  ? Obesity affecting pregnancy, antepartum BMI=30.3 02/01/2021  ? History of cesarean delivery LTCS 10/14/17 for +CST and category 2 FHR tracing 02/01/2021  ? Uterine fibroid 10/28/2017  ? ?Myles Gip PT, DPT [phone]

## 2021-10-25 ENCOUNTER — Ambulatory Visit: Payer: Medicaid Other | Admitting: Physical Therapy

## 2021-10-25 ENCOUNTER — Other Ambulatory Visit: Payer: Self-pay

## 2021-10-25 ENCOUNTER — Encounter: Payer: Self-pay | Admitting: Physical Therapy

## 2021-10-25 DIAGNOSIS — R102 Pelvic and perineal pain: Secondary | ICD-10-CM

## 2021-10-25 DIAGNOSIS — R278 Other lack of coordination: Secondary | ICD-10-CM

## 2021-10-25 DIAGNOSIS — M6281 Muscle weakness (generalized): Secondary | ICD-10-CM

## 2021-10-25 NOTE — Therapy (Signed)
[place] ?[facility] ?[address] [place], [place], [address] ?Phone: [phone]   Fax:  [phone] ? ?Physical Therapy Treatment ? ?Patient Details  ?Name: Alexandra Fernandez ?MRN: [id number] ?Date of Birth: 1991-04-17 ?Referring Provider (PT): Hup[place] ? ? ?Encounter Date: 10/25/2021 ? ? PT End of Session - 10/25/21 1203   ? ? Visit Number 3   ? Number of Visits 12   ? Date for PT Re-Evaluation 01/03/22   ? Authorization Type IE 10/11/2021   ? PT Start Time 1200   ? PT Stop Time 1240   ? PT Time Calculation (min) 40 min   ? Activity Tolerance Patient tolerated treatment well   ? Behavior During Therapy [facility] for tasks assessed/performed   ? ?  ?  ? ?  ? ? ?Past Medical History:  ?Diagnosis Date  ? Abnormal glucose affecting pregnancy 08/14/2017  ? Acid reflux   ? Anxiety and depression   ? Broken arm   ? AGe 71; broke left armc when thrown from horse  ? Colitis   ? Hyperthyroidism 04/06/2021  ? Preeclampsia, unspecified trimester 10/14/2017  ? Uterine fibroid 10/28/2017  ? ? ?Past Surgical History:  ?Procedure Laterality Date  ? CESAREAN SECTION    ? LAPAROSCOPIC APPENDECTOMY N/A 02/20/2016  ? Procedure: APPENDECTOMY LAPAROSCOPIC;  Surgeon: Florene Glen, MD;  Location: [facility] ORS;  Service: General;  Laterality: N/A;  ? ? ?There were no vitals filed for this visit. ? ? Subjective Assessment - 10/25/21 1207   ? ? Subjective Patient notes that she had two days without leaks after using abdominal support band. Patient stopped wearing band because it was too tight and had increased pain in the lower abdomen and back after discontinuing use. Patient also had pain with ascending steps after discontinuing band use. The pain only lasted 30 - 60 min and has not returned. Patient did have return of UI in the afternoon after discontinuing band.   ? Currently in Pain? No/denies   ? ?  ?  ? ?  ? ? ?TREATMENT ? ?Manual Therapy: ? ? ?Neuromuscular Re-education: ?Supine hooklying  diaphragmatic breathing with VCs and TCs for downregulation of the nervous system and improved management of IAP ?Supine hooklying TrA with coordinated breath for improved posture and support ?Supine hooklying TrA with scap protraction and coordinated breath for improved rib position and correction of hyperextension at thoracolumbar jxn ?Supine hooklying TrA with alternating bent knee fallout and coordinated breath for improved postural control ?Supine hooklying TrA with alternating mini march and coordinated breath for improved postural control ? ?Therapeutic Exercise: ? ? ?Treatments unbilled: ? ?Post-treatment assessment: ? ?Patient educated throughout session on appropriate technique and form using multi-modal cueing, HEP, and activity modification. Patient articulated understanding and returned demonstration. ? ?Patient Response to interventions: ?Notes difficulty with marches, L> R ? ?ASSESSMENT ?Patient presents to clinic with excellent motivation to participate in therapy. Patient demonstrates deficits in PFM strength, PFM coordination, IAP management, and pain. Patient with excellent TrA activation with scapular protraction for rib flare correction during today's session and responded positively to active interventions. Patient will benefit from continued skilled therapeutic intervention to address remaining deficits in PFM strength, PFM coordination, IAP management, and pain in order to increase function and improve overall QOL. ? ? ? PT Long Term Goals - 10/11/21 1700   ? ?  ? PT LONG TERM GOAL #1  ? Title Patient will demonstrate independence with HEP in order to maximize therapeutic gains  and improve carryover from physical therapy sessions to ADLs in the home and community.   ? Baseline IE: not initiated   ? Time 12   ? Period Weeks   ? Status New   ? Target Date 01/03/22   ?  ? PT LONG TERM GOAL #2  ? Title Patient will demonstrate circumferential and sequential contraction of >4/5 MMT, > 6 sec hold  x10 and 5 consecutive quick flicks with </= 10 min rest between testing bouts, and relaxation of the PFM coordinated with breath for improved management of intra-abdominal pressure and normal bowel and bladder function without the presence of pain nor incontinence in order to improve participation at home and in the community.   ? Baseline IE: assess ast next visit   ? Time 12   ? Period Weeks   ? Status New   ? Target Date 01/03/22   ?  ? PT LONG TERM GOAL #3  ? Title Patient will decrease worst pain as reported on NPRS by at least 2 points to demonstrate clinically significant reduction in pain in order to restore/improve function and overall QOL.   ? Baseline IE: 5/10   ? Time 12   ? Period Weeks   ? Status New   ? Target Date 01/03/22   ?  ? PT LONG TERM GOAL #4  ? Title Patient will demonstrate improved function as evidenced by a score of 60 on FOTO measure for full participation in activities at home and in the community.   ? Baseline IE: 42   ? Time 12   ? Period Weeks   ? Status New   ? Target Date 01/03/22   ? ?  ?  ? ?  ? ? ? ? ? ? ? ? Plan - 10/25/21 1203   ? ? Clinical Impression Statement Patient presents to clinic with excellent motivation to participate in therapy. Patient demonstrates deficits in PFM strength, PFM coordination, IAP management, and pain. Patient with excellent TrA activation with scapular protraction for rib flare correction during today's session and responded positively to active interventions. Patient will benefit from continued skilled therapeutic intervention to address remaining deficits in PFM strength, PFM coordination, IAP management, and pain in order to increase function and improve overall QOL.   ? Personal Factors and Comorbidities Comorbidity 3+;Behavior Pattern;Past/Current Experience   ? Comorbidities anxiety and depression, hyperthyroidism, colitis, acid reflux   ? Examination-Activity Limitations Continence;Squat;Sleep;Lift;Transfers   ? Examination-Participation  Restrictions Interpersonal Relationship;Cleaning;Community Activity;Shop   ? Stability/Clinical Decision Making Evolving/Moderate complexity   ? Rehab Potential Good   ? PT Frequency 1x / week   ? PT Duration 12 weeks   ? PT Treatment/Interventions ADLs/Self Care Home Management;Biofeedback;Cryotherapy;Electrical Stimulation;Moist Heat;Therapeutic activities;Therapeutic exercise;Neuromuscular re-education;Patient/family education;Orthotic Fit/Training;Manual techniques;Scar mobilization;Dry needling;Taping;Joint Manipulations;Spinal Manipulations   ? PT Next Visit Plan deep core   ? PT Home Exercise Plan [place]   ? Consulted and Agree with Plan of Care Patient   ? ?  ?  ? ?  ? ? ?Patient will benefit from skilled therapeutic intervention in order to improve the following deficits and impairments:  Decreased activity tolerance, Decreased strength, Decreased coordination, Pain, Improper body mechanics, Decreased scar mobility, Decreased endurance ? ?Visit Diagnosis: ?Muscle weakness (generalized) ? ?Other lack of coordination ? ?Pelvic pain ? ? ? ? ?Problem List ?Patient Active Problem List  ? Diagnosis Date Noted  ? Hyperthyroidism 02/01/21 04/06/2021  ? History of preeclampsia in last pregnancy 158/100 on 09/28/17 and 134/94 on admission 04/06/2021  ?  Abnormal blood finding low TSH 02/01/21 02/07/2021  ? Supervision of other normal pregnancy, antepartum 02/01/2021  ? Obesity affecting pregnancy, antepartum BMI=30.3 02/01/2021  ? History of cesarean delivery LTCS 10/14/17 for +CST and category 2 FHR tracing 02/01/2021  ? Uterine fibroid 10/28/2017  ? ?Myles Gip PT, DPT [phone]  ?10/25/2021, 1:18 PM ? ?[place] ?[facility] ?[address] [place], [place], [address] ?Phone: [phone]   Fax:  [phone] ? ?Name: Charolette Bultman ?MRN: [id number] ?Date of Birth: Dec 29, 1990 ? ? ? ?

## 2021-10-26 ENCOUNTER — Other Ambulatory Visit: Payer: Self-pay | Admitting: Physician Assistant

## 2021-10-26 DIAGNOSIS — Z0000 Encounter for general adult medical examination without abnormal findings: Secondary | ICD-10-CM

## 2021-10-26 NOTE — Telephone Encounter (Signed)
Pt transferred to [facility], recommend to reach out to provider at [facility]  ?

## 2021-11-01 ENCOUNTER — Encounter: Payer: Self-pay | Admitting: Physical Therapy

## 2021-11-01 ENCOUNTER — Ambulatory Visit: Payer: Medicaid Other | Admitting: Physical Therapy

## 2021-11-01 DIAGNOSIS — R278 Other lack of coordination: Secondary | ICD-10-CM

## 2021-11-01 DIAGNOSIS — M6281 Muscle weakness (generalized): Secondary | ICD-10-CM

## 2021-11-01 DIAGNOSIS — R102 Pelvic and perineal pain: Secondary | ICD-10-CM

## 2021-11-01 NOTE — Therapy (Signed)
?OUTPATIENT PHYSICAL THERAPY TREATMENT NOTE ? ? ?Patient Name: Alexandra Fernandez ?MRN: [id number] ?DOB:[birth year], 31 y.o., female ?Today's Date: 11/01/2021 ? ?PCP: Patient, No Pcp Per (Inactive) ?REFERRING PROVIDER: Josie Saunders, NP ? ? PT End of Session - 11/01/21 1209   ? ? Visit Number 4   ? Number of Visits 12   ? Date for PT Re-Evaluation 01/03/22   ? Authorization Type IE 10/11/2021   ? PT Start Time 1205   ? PT Stop Time 1540   ? PT Time Calculation (min) 40 min   ? Activity Tolerance Patient tolerated treatment well   ? Behavior During Therapy [facility] for tasks assessed/performed   ? ?  ?  ? ?  ? ? ?Past Medical History:  ?Diagnosis Date  ? Abnormal glucose affecting pregnancy 08/14/2017  ? Acid reflux   ? Anxiety and depression   ? Broken arm   ? AGe 48; broke left armc when thrown from horse  ? Colitis   ? Hyperthyroidism 04/06/2021  ? Preeclampsia, unspecified trimester 10/14/2017  ? Uterine fibroid 10/28/2017  ? ?Past Surgical History:  ?Procedure Laterality Date  ? CESAREAN SECTION    ? LAPAROSCOPIC APPENDECTOMY N/A 02/20/2016  ? Procedure: APPENDECTOMY LAPAROSCOPIC;  Surgeon: Florene Glen, MD;  Location: [facility] ORS;  Service: General;  Laterality: N/A;  ? ?Patient Active Problem List  ? Diagnosis Date Noted  ? Hyperthyroidism 02/01/21 04/06/2021  ? History of preeclampsia in last pregnancy 158/100 on 09/28/17 and 134/94 on admission 04/06/2021  ? Abnormal blood finding low TSH 02/01/21 02/07/2021  ? Supervision of other normal pregnancy, antepartum 02/01/2021  ? Obesity affecting pregnancy, antepartum BMI=30.3 02/01/2021  ? History of cesarean delivery [facility]CS 10/14/17 for +CST and category 2 FHR tracing 02/01/2021  ? Uterine fibroid 10/28/2017  ? ? ?REFERRING DIAG: N39.3 (ICD-10-CM) - Stress incontinence (female) ? ?THERAPY DIAG:  ?Muscle weakness (generalized) ? ?Other lack of coordination ? ?Pelvic pain ? ?PERTINENT HISTORY: DD 08/15/2021, SVD-VBAC ?2nd degree lacerations ? ?PRECAUTIONS: None ? ?SUBJECTIVE:  Patient notes that she has been doing exercises, but not able to do them daily. Patient notes she feels uncomfortable when participating in sexual activity. Patient notes that she is having decreased sensation and is unable to achieve climax. Patient notes that even with use of vibratory stimulation at this point she cannot achieve climax. Reports that pain at the scar site is negligible, but her discomfort with sexual activity is focused more at the clitoris and surrounding tissues where she does not feel a sensation that is equal to the amount of stimulus provided. She does articulate some burning sensation but does not describe this as painful. Patient has had improvement in UI.  ? ?PAIN:  ?Are you having pain? No ? ?TREATMENT ? ?Manual Therapy: ? ? ?Neuromuscular Re-education: ?Reassessed goals; see below.  ? ?Therapeutic Exercise: ? ? ?Treatments unbilled: ? ?Post-treatment assessment: ? ?Patient educated throughout session on appropriate technique and form using multi-modal cueing, HEP, and activity modification. Patient articulated understanding and returned demonstration. ? ?Patient Response to interventions: ?Interested in referral for provider for sexual function ? ?ASSESSMENT ?Patient presents to clinic with excellent motivation to participate in therapy. Patient demonstrates deficits in PFM strength, PFM coordination, IAP management, and pain. Patient indicating significant progress toward goal achievement during today's session and responded has positively to active interventions and been diligent with HEP. Patient continues to express significant concern/distress regarding sexual dysfunction and change in sensory experience at clitoris and surrounding tissues; DPT and patient discussed  referral to specialist for further evaluation and patient amenable. Patient will benefit from continued skilled therapeutic intervention to address remaining deficits in PFM strength, PFM coordination, IAP management,  and pain in order to increase function and improve overall QOL. ? ? PT Long Term Goals   ? ?  ? PT LONG TERM GOAL #1  ? Title Patient will demonstrate independence with HEP in order to maximize therapeutic gains and improve carryover from physical therapy sessions to ADLs in the home and community.   ? Baseline IE: not initiated; 3/30: IND   ? Time 12   ? Period Weeks   ? Status Achieved   ? Target Date 01/03/22   ?  ? PT LONG TERM GOAL #2  ? Title Patient will demonstrate circumferential and sequential contraction of >4/5 MMT, > 6 sec hold x10 and 5 consecutive quick flicks with </= 10 min rest between testing bouts, and relaxation of the PFM coordinated with breath for improved management of intra-abdominal pressure and normal bowel and bladder function without the presence of pain nor incontinence in order to improve participation at home and in the community.   ? Baseline IE: assess ast next visit; 3/30: 2-8/3 MMT, x5 quick flicks, 4 sec hold   ? Time 12   ? Period Weeks   ? Status On-going   ? Target Date 01/03/22   ?  ? PT LONG TERM GOAL #3  ? Title Patient will decrease worst pain as reported on NPRS by at least 2 points to demonstrate clinically significant reduction in pain in order to restore/improve function and overall QOL.   ? Baseline IE: 5/10; 3/30: 2/10   ? Time 12   ? Period Weeks   ? Status On-going   ? Target Date 01/03/22   ?  ? PT LONG TERM GOAL #4  ? Title Patient will demonstrate improved function as evidenced by a score of 60 on FOTO measure for full participation in activities at home and in the community.   ? Baseline IE: 44; 3/30:64   ? Time 12   ? Period Weeks   ? Status Achieved   ? Target Date 01/03/22   ? ?  ?  ? ?  ?  ? ? Plan   ? ? Clinical Impression Statement Patient presents to clinic with excellent motivation to participate in therapy. Patient demonstrates deficits in PFM strength, PFM coordination, IAP management, and pain. Patient indicating significant progress toward goal  achievement during today's session and responded has positively to active interventions and been diligent with HEP. Patient continues to express significant concern/distress regarding sexual dysfunction and change in sensory experience at clitoris and surrounding tissues; DPT and patient discussed referral to specialist for further evaluation and patient amenable. Patient will benefit from continued skilled therapeutic intervention to address remaining deficits in PFM strength, PFM coordination, IAP management, and pain in order to increase function and improve overall QOL.   ? Personal Factors and Comorbidities Comorbidity 3+;Behavior Pattern;Past/Current Experience   ? Comorbidities anxiety and depression, hyperthyroidism, colitis, acid reflux   ? Examination-Activity Limitations Continence;Squat;Sleep;Lift;Transfers   ? Examination-Participation Restrictions Interpersonal Relationship;Cleaning;Community Activity;Shop   ? Stability/Clinical Decision Making Evolving/Moderate complexity   ? Rehab Potential Good   ? PT Frequency 1x / week   ? PT Duration 12 weeks   ? PT Treatment/Interventions ADLs/Self Care Home Management;Biofeedback;Cryotherapy;Electrical Stimulation;Moist Heat;Therapeutic activities;Therapeutic exercise;Neuromuscular re-education;Patient/family education;Orthotic Fit/Training;Manual techniques;Scar mobilization;Dry needling;Taping;Joint Manipulations;Spinal Manipulations   ? PT Next Visit Plan deep core   ?  PT Home Exercise Plan [place]   ? Consulted and Agree with Plan of Care Patient   ? ?  ?  ? ?  ? ? Myles Gip PT, DPT [phone]  ?11/01/2021, 5:55 PM ? ?  ? ?

## 2021-11-08 ENCOUNTER — Encounter: Payer: Self-pay | Admitting: Physical Therapy

## 2021-11-08 ENCOUNTER — Ambulatory Visit: Payer: Medicaid Other | Attending: Nurse Practitioner | Admitting: Physical Therapy

## 2021-11-08 DIAGNOSIS — R278 Other lack of coordination: Secondary | ICD-10-CM | POA: Diagnosis present

## 2021-11-08 DIAGNOSIS — R102 Pelvic and perineal pain: Secondary | ICD-10-CM | POA: Insufficient documentation

## 2021-11-08 DIAGNOSIS — M6281 Muscle weakness (generalized): Secondary | ICD-10-CM | POA: Diagnosis present

## 2021-11-08 NOTE — Therapy (Signed)
?OUTPATIENT PHYSICAL THERAPY TREATMENT NOTE ? ? ?Patient Name: Alexandra Fernandez ?MRN: [id number] ?DOB:11-30-1990, 31 y.o., female ?Today's Date: 11/08/2021 ? ?PCP: Patient, No Pcp Per (Inactive) ?REFERRING PROVIDER: Josie Saunders, NP ? ? PT End of Session - 11/08/21 1158   ? ? Visit Number 5   ? Number of Visits 12   ? Date for PT Re-Evaluation 01/03/22   ? Authorization Type IE 10/11/2021   ? PT Start Time 1200   ? PT Stop Time 1240   ? PT Time Calculation (min) 40 min   ? Activity Tolerance Patient tolerated treatment well   ? Behavior During Therapy [facility] for tasks assessed/performed   ? ?  ?  ? ?  ? ? ?Past Medical History:  ?Diagnosis Date  ? Abnormal glucose affecting pregnancy 08/14/2017  ? Acid reflux   ? Anxiety and depression   ? Broken arm   ? AGe 37; broke left armc when thrown from horse  ? Colitis   ? Hyperthyroidism 04/06/2021  ? Preeclampsia, unspecified trimester 10/14/2017  ? Uterine fibroid 10/28/2017  ? ?Past Surgical History:  ?Procedure Laterality Date  ? CESAREAN SECTION    ? LAPAROSCOPIC APPENDECTOMY N/A 02/20/2016  ? Procedure: APPENDECTOMY LAPAROSCOPIC;  Surgeon: Florene Glen, MD;  Location: [facility] ORS;  Service: General;  Laterality: N/A;  ? ?Patient Active Problem List  ? Diagnosis Date Noted  ? Hyperthyroidism 02/01/21 04/06/2021  ? History of preeclampsia in last pregnancy 158/100 on 09/28/17 and 134/94 on admission 04/06/2021  ? Abnormal blood finding low TSH 02/01/21 02/07/2021  ? Supervision of other normal pregnancy, antepartum 02/01/2021  ? Obesity affecting pregnancy, antepartum BMI=30.3 02/01/2021  ? History of cesarean delivery LTCS 10/14/17 for +CST and category 2 FHR tracing 02/01/2021  ? Uterine fibroid 10/28/2017  ? ? ?REFERRING DIAG: N39.3 (ICD-10-CM) - Stress incontinence (female) ? ?THERAPY DIAG:  ?Muscle weakness (generalized) ? ?Other lack of coordination ? ?Pelvic pain ? ?PERTINENT HISTORY: DD 08/15/2021, SVD-VBAC ?2nd degree lacerations ? ?PRECAUTIONS: None ? ?SUBJECTIVE:  Patient states that she was able to store urine without accidents. Patient has not had any UI since last visit. She does have a small trampoline at home and is hoping to test the strength of her muscles with that activity, but does note that she still crosses her legs when she sneezes because she does not trust that she will be able to control her bladder. Patient clarifies that her difficulties with sensation during sexual activity pre-dated childbirth but she feels they have worsened since childbirth. ? ?Last visit: ?Patient notes that she has been doing exercises, but not able to do them daily. Patient notes she feels uncomfortable when participating in sexual activity. Patient notes that she is having decreased sensation and is unable to achieve climax. Patient notes that even with use of vibratory stimulation at this point she cannot achieve climax. Reports that pain at the scar site is negligible, but her discomfort with sexual activity is focused more at the clitoris and surrounding tissues where she does not feel a sensation that is equal to the amount of stimulus provided. She does articulate some burning sensation but does not describe this as painful. Patient has had improvement in UI.  ? ?PAIN:  ?Are you having pain? No ? ?TREATMENT ? ?Manual Therapy: ? ? ?Neuromuscular Re-education: ?Patient education on circular model of female sexual response, biopsychosocial factors impacting sexual function, and fear avoidance model for sexual pain. Patient provided with handouts to reinforce concepts discussed. ?Patient provided  with information on [facility] and Spanish-speaking provider for comprehensive management of sexual dysfunction.  ?Seated PFM contraction with cough for improved coordination with sudden increase in IAP ?Patient education on trial of gentle bouncing/jumping on home mini trampoline for PFM strengthening. ? ?Therapeutic Exercise: ? ? ?Treatments unbilled: ? ?Post-treatment  assessment: ? ?Patient educated throughout session on appropriate technique and form using multi-modal cueing, HEP, and activity modification. Patient articulated understanding and returned demonstration. ? ?Patient Response to interventions: ?Comfortable to return in 2 weeks ? ? ? PT Long Term Goals   ? ?  ? PT LONG TERM GOAL #1  ? Title Patient will demonstrate independence with HEP in order to maximize therapeutic gains and improve carryover from physical therapy sessions to ADLs in the home and community.   ? Baseline IE: not initiated; 3/30: IND   ? Time 12   ? Period Weeks   ? Status Achieved   ? Target Date 01/03/22   ?  ? PT LONG TERM GOAL #2  ? Title Patient will demonstrate circumferential and sequential contraction of >4/5 MMT, > 6 sec hold x10 and 5 consecutive quick flicks with </= 10 min rest between testing bouts, and relaxation of the PFM coordinated with breath for improved management of intra-abdominal pressure and normal bowel and bladder function without the presence of pain nor incontinence in order to improve participation at home and in the community.   ? Baseline IE: assess ast next visit; 3/30: 7-2/0 MMT, x5 quick flicks, 4 sec hold   ? Time 12   ? Period Weeks   ? Status On-going   ? Target Date 01/03/22   ?  ? PT LONG TERM GOAL #3  ? Title Patient will decrease worst pain as reported on NPRS by at least 2 points to demonstrate clinically significant reduction in pain in order to restore/improve function and overall QOL.   ? Baseline IE: 5/10; 3/30: 2/10   ? Time 12   ? Period Weeks   ? Status On-going   ? Target Date 01/03/22   ?  ? PT LONG TERM GOAL #4  ? Title Patient will demonstrate improved function as evidenced by a score of 60 on FOTO measure for full participation in activities at home and in the community.   ? Baseline IE: 1; 3/30:64   ? Time 12   ? Period Weeks   ? Status Achieved   ? Target Date 01/03/22   ? ?  ?  ? ?  ?  ? ? Plan   ? ? Clinical Impression Statement Patient  presents to clinic with excellent motivation to participate in therapy. Patient demonstrates deficits in PFM strength, PFM coordination, IAP management, and pain. Patient receptive to educational interventions regarding female sexual response and factors impacting sexual function during today's session and responded positively to active interventions. Patient continues to express significant concern/distress regarding sexual dysfunction and change in sensory experience at clitoris and surrounding tissues and was provided with information for gynecology follow-up as well as sex therapist options in the area. Patient will benefit from continued skilled therapeutic intervention to address remaining deficits in PFM strength, PFM coordination, IAP management, and pain in order to increase function and improve overall QOL.   ? Personal Factors and Comorbidities Comorbidity 3+;Behavior Pattern;Past/Current Experience   ? Comorbidities anxiety and depression, hyperthyroidism, colitis, acid reflux   ? Examination-Activity Limitations Continence;Squat;Sleep;Lift;Transfers   ? Examination-Participation Restrictions Interpersonal Relationship;Cleaning;Community Activity;Shop   ? Stability/Clinical Decision Making Evolving/Moderate complexity   ?  Rehab Potential Good   ? PT Frequency 1x / week   ? PT Duration 12 weeks   ? PT Treatment/Interventions ADLs/Self Care Home Management;Biofeedback;Cryotherapy;Electrical Stimulation;Moist Heat;Therapeutic activities;Therapeutic exercise;Neuromuscular re-education;Patient/family education;Orthotic Fit/Training;Manual techniques;Scar mobilization;Dry needling;Taping;Joint Manipulations;Spinal Manipulations   ? PT Next Visit Plan deep core   ? PT Home Exercise Plan [place]   ? Consulted and Agree with Plan of Care Patient   ? ?  ?  ? ?  ? ? ?Myles Gip PT, DPT [phone]  ?11/08/2021, 11:58 AM ? ?  ? ?

## 2021-11-15 ENCOUNTER — Encounter: Payer: Medicaid Other | Admitting: Physical Therapy

## 2021-11-21 ENCOUNTER — Ambulatory Visit: Payer: Medicaid Other | Admitting: Physical Therapy

## 2021-11-22 ENCOUNTER — Encounter: Payer: Medicaid Other | Admitting: Physical Therapy

## 2021-11-29 ENCOUNTER — Ambulatory Visit: Payer: Medicaid Other | Admitting: Physical Therapy

## 2022-12-20 ENCOUNTER — Encounter: Payer: Self-pay | Admitting: Emergency Medicine

## 2022-12-20 ENCOUNTER — Other Ambulatory Visit: Payer: Self-pay

## 2022-12-20 ENCOUNTER — Emergency Department: Payer: Medicaid Other

## 2022-12-20 ENCOUNTER — Emergency Department
Admission: EM | Admit: 2022-12-20 | Discharge: 2022-12-20 | Disposition: A | Discharge status: Home / Self Care | Payer: Medicaid Other | Attending: Emergency Medicine | Admitting: Emergency Medicine

## 2022-12-20 DIAGNOSIS — K5900 Constipation, unspecified: Secondary | ICD-10-CM | POA: Diagnosis not present

## 2022-12-20 DIAGNOSIS — R1084 Generalized abdominal pain: Secondary | ICD-10-CM

## 2022-12-20 DIAGNOSIS — R109 Unspecified abdominal pain: Secondary | ICD-10-CM | POA: Diagnosis present

## 2022-12-20 DIAGNOSIS — N200 Calculus of kidney: Secondary | ICD-10-CM | POA: Insufficient documentation

## 2022-12-20 LAB — URINALYSIS, ROUTINE W REFLEX MICROSCOPIC
Bilirubin Urine: NEGATIVE
Glucose, UA: NEGATIVE mg/dL
Hgb urine dipstick: NEGATIVE
Ketones, ur: NEGATIVE mg/dL
Leukocytes,Ua: NEGATIVE
Nitrite: NEGATIVE
Protein, ur: NEGATIVE mg/dL
Specific Gravity, Urine: 1.018 (ref 1.005–1.030)
pH: 5 (ref 5.0–8.0)

## 2022-12-20 LAB — COMPREHENSIVE METABOLIC PANEL
ALT: 20 U/L (ref 0–44)
AST: 16 U/L (ref 15–41)
Albumin: 3.8 g/dL (ref 3.5–5.0)
Alkaline Phosphatase: 62 U/L (ref 38–126)
Anion gap: 6 (ref 5–15)
BUN: 13 mg/dL (ref 6–20)
CO2: 23 mmol/L (ref 22–32)
Calcium: 8.8 mg/dL — ABNORMAL LOW (ref 8.9–10.3)
Chloride: 108 mmol/L (ref 98–111)
Creatinine, Ser: 0.63 mg/dL (ref 0.44–1.00)
GFR, Estimated: >60 mL/min (ref >60)
Glucose, Bld: 100 mg/dL — ABNORMAL HIGH (ref 70–99)
Potassium: 3.8 mmol/L (ref 3.5–5.1)
Sodium: 137 mmol/L (ref 135–145)
Total Bilirubin: 0.8 mg/dL (ref 0.3–1.2)
Total Protein: 7.3 g/dL (ref 6.5–8.1)

## 2022-12-20 LAB — CBC
HCT: 35.2 % — ABNORMAL LOW (ref 36.0–46.0)
Hemoglobin: 11.7 g/dL — ABNORMAL LOW (ref 12.0–15.0)
MCH: 29.3 pg (ref 26.0–34.0)
MCHC: 33.2 g/dL (ref 30.0–36.0)
MCV: 88.2 fL (ref 80.0–100.0)
Platelets: 280 10*3/uL (ref 150–400)
RBC: 3.99 MIL/uL (ref 3.87–5.11)
RDW: 13.2 % (ref 11.5–15.5)
WBC: 4.4 10*3/uL (ref 4.0–10.5)
nRBC: 0 % (ref 0.0–0.2)

## 2022-12-20 LAB — LIPASE, BLOOD: Lipase: 25 U/L (ref 11–51)

## 2022-12-20 LAB — POC URINE PREG, ED: Preg Test, Ur: NEGATIVE

## 2022-12-20 MED ORDER — DOCUSATE SODIUM 100 MG PO CAPS: 100.0000 mg | ORAL_CAPSULE | Freq: Every day | ORAL | 2 refills | Status: AC

## 2022-12-20 MED ORDER — POLYETHYLENE GLYCOL 3350 17 GM/SCOOP PO POWD: 17.0000 g | Freq: Every day | ORAL | 0 refills | Status: DC

## 2022-12-20 MED ORDER — DICYCLOMINE HCL 10 MG PO CAPS: 10.0000 mg | ORAL_CAPSULE | Freq: Three times a day (TID) | ORAL | 0 refills | Status: DC | PRN

## 2022-12-20 NOTE — ED Provider Notes (Signed)
[facility] Provider Note    Event Date/Time   First MD Initiated Contact with Patient 12/20/22 [phone]     (approximate)  History   Chief Complaint: Abdominal Pain  HPI  Alexandra Fernandez is a 32 y.o. female with a past medical history of gastric reflux, presents to the emergency department for abdominal pain.  According to the patient for the past several years she has been experiencing abdominal discomfort intermittently.  Patient states she was taking MiraLAX for constipation when she needs it.  Patient states over the last several days she has had worsening pain mostly on the right side.  Patient states she is status post appendectomy years ago.  Denies any fever denies any urinary symptoms no vaginal bleeding or discharge.  No vomiting or diarrhea.  Does state mild constipation currently.  Physical Exam   Triage Vital Signs: ED Triage Vitals  Enc Vitals Group     BP 12/20/22 0842 92/78     Pulse Rate 12/20/22 0842 73     Resp 12/20/22 0842 16     Temp 12/20/22 0842 98.5 F (36.9 C)     Temp Source 12/20/22 0842 Oral     SpO2 12/20/22 0842 100 %     Weight 12/20/22 0843 182 lb 8 oz (82.8 kg)     Height 12/20/22 0843 5\' 5"  (1.651 m)     Head Circumference --      Peak Flow --      Pain Score 12/20/22 0843 8     Pain Loc --      Pain Edu? --      Excl. in GC? --     Most recent vital signs: Vitals:   12/20/22 0842  BP: 92/78  Pulse: 73  Resp: 16  Temp: 98.5 F (36.9 C)  SpO2: 100%    General: Awake, no distress.  CV:  Good peripheral perfusion.  Regular rate and rhythm  Resp:  Normal effort.  Equal breath sounds bilaterally.  Abd:  No distention.  Soft, mild suprapubic to right-sided tenderness.  No rebound or guarding.  Mild epigastric tenderness.   ED Results / Procedures / Treatments   RADIOLOGY  I have reviewed and interpreted the CT images I do not see any significant abnormality such as a bowel obstruction. Radiology is  read the CT scan is showing a 2 mm right kidney stone but no other finding.   MEDICATIONS ORDERED IN ED: Medications - No data to display   IMPRESSION / MDM / ASSESSMENT AND PLAN / ED COURSE  I reviewed the triage vital signs and the nursing notes.  Patient's presentation is most consistent with acute presentation with potential threat to life or bodily function.  Patient presents emergency department for abdominal discomfort.  States that has been ongoing for several years intermittently but worse over the past several days.  Patient describes more of a spasm type pain in her abdomen does state a history of constipation.  Patient is workup is reassuring with a normal chemistry normal CBC with a normal white blood cell count, urinalysis is normal as well with no blood seen.  Lipase is negative LFTs are normal.  Pregnancy is negative.  Patient CT scan shows a right renal stone but no ureterolithiasis, does have signs of mild constipation but no significant findings on CT.  Patient's description of the pain coming and going intermittently along with episodes of constipation are concerning for possible IBS.  I believe it would  be worthwhile for the patient follow-up with a GI physician to discuss this further.  We will place the patient on Bentyl as well as daily Colace.  I discussed with the patient still using MiraLAX as needed for constipation but using Colace on a daily basis and Bentyl as needed for abdominal pain/spasms.  I will refer to GI medicine for further workup.  Patient agreeable to plan of care.  FINAL CLINICAL IMPRESSION(S) / ED DIAGNOSES   Abdominal pain Constipation  Rx / DC Orders   Colace Bentyl GI follow-up  Note:  This document was prepared using Dragon voice recognition software and may include unintentional dictation errors.   Minna Antis, MD 12/20/22 1042

## 2022-12-20 NOTE — Discharge Instructions (Addendum)
Please call the number provided for GI medicine to arrange a follow-up appointment.  Please begin taking Colace capsule each morning.  You may use your prescribed Bentyl as needed for abdominal pain/spasm.  Please also use MiraLAX as needed for constipation up to twice daily.  Please drink plenty of fluids.  Return to the emergency department for any worsening pain development of fever or any other symptom personally concerning to yourself.

## 2022-12-20 NOTE — ED Triage Notes (Signed)
Pt here with abd pain that is getting worse. Pt states last night she had a lot of bloating. Pt states she feels bloated on both sides. Pt states she had N/V a few days ago.

## 2023-01-01 NOTE — Progress Notes (Signed)
Celso Amy, PA-C [address]  [address]  [place], [place] [address]  Main: [phone]  Fax: [phone]   Gastroenterology Consultation  Referring Provider:     [facility]* Primary Care Physician:  [facility] Primary Gastroenterologist:  Celso Amy, PA-C / Dr. Wyline Mood  Reason for Consultation:     ED f/u Abdominal Pain        HPI:   Alexandra Fernandez is a 32 y.o. y/o female referred for consultation & management  by [facility] She presents for ED follow-up of abdominal pain.  She went to [facility] ED 12/20/2022.  Has been having intermittent abdominal discomfort for several years.  History of GERD and constipation.  Was taking MiraLAX as needed.  Had worsening right side abdominal pain several days prior to her ED visit.  History of appendectomy many years ago.  Has not had any fever, vomiting, diarrhea, or urinary symptoms.  She was told to continue MiraLAX, start Colace, and started on dicyclomine for possible IBS.  Labs 12/20/2022 showed normal CMP, lipase, and LFTs.  Mild anemia with hemoglobin 11.7, MCV 88.  Normal white count 4.4.  Negative pregnancy test.  Negative urinalysis.  CT abdomen and pelvis without contrast 12/20/2022 showed no acute abnormality.  Incidental 2 mm right renal calculus midpole of right kidney with no obstruction.  IUD in place.  Has had chronic constipation all of her life since childhood.  She reports having hard infrequent bowel movements with straining.  Occasional episode of rectal bleeding when she strains.  Currently taking MiraLAX 1 capful once or twice daily plus Colace stool softener with benefit.  She has generalized abdominal pain located through her entire upper and lower abdomen.  She tried dicyclomine which helped with abdominal cramping.  Abdomen feels very bloated and swollen.  She struggles with weight gain.  Has episodes of menorrhagia followed by GYN.  Currently has IUD.  No  recent episodes of heartburn.  Denies dysphagia.  Denies family history of colon cancer or IBD.  Past Medical History:  Diagnosis Date   Abnormal glucose affecting pregnancy 08/14/2017   Acid reflux    Anxiety and depression    Broken arm    [place] 62; broke left armc when thrown from horse   Colitis    Hyperthyroidism 04/06/2021   Preeclampsia, unspecified trimester 10/14/2017   Uterine fibroid 10/28/2017    Past Surgical History:  Procedure Laterality Date   CESAREAN SECTION     LAPAROSCOPIC APPENDECTOMY N/A 02/20/2016   Procedure: APPENDECTOMY LAPAROSCOPIC;  Surgeon: Lattie Haw, MD;  Location: [facility] ORS;  Service: General;  Laterality: N/A;    Prior to Admission medications   Medication Sig Start Date End Date Taking? Authorizing Provider  dicyclomine (BENTYL) 10 MG capsule Take 1 capsule (10 mg total) by mouth 3 (three) times daily as needed for spasms. 12/20/22   Minna Antis, MD  docusate sodium (COLACE) 100 MG capsule Take 1 capsule (100 mg total) by mouth daily. 12/20/22 12/20/23  Minna Antis, MD  methimazole (TAPAZOLE) 5 MG tablet Take 5 mg by mouth 2 (two) times daily.    [provider]  polyethylene glycol powder (GLYCOLAX/MIRALAX) 17 GM/SCOOP powder Take 17 g by mouth daily. 12/20/22   Minna Antis, MD  Prenatal Vit-Fe Fumarate-FA (PRENATAL VITAMINS) 28-0.8 MG TABS Take 1 tablet by mouth daily. 03/24/20   Matt Holmes, PA    Family History  Problem Relation Age of Onset  Asthma Mother    Anemia Mother    Fibroids Mother    Diabetes Father        prediabetic   Kidney Stones Sister    Multiple births Maternal Uncle    Cancer Maternal Grandmother        lung   Stroke Paternal Grandmother    Heart disease Paternal Grandmother        had pacemaker   Alcohol abuse Neg Hx    Arthritis Neg Hx    Birth defects Neg Hx    COPD Neg Hx    Depression Neg Hx    Drug abuse Neg Hx    Early death Neg Hx    Hearing loss Neg Hx     Hyperlipidemia Neg Hx    Hypertension Neg Hx    Kidney disease Neg Hx    Learning disabilities Neg Hx    Mental illness Neg Hx    Mental retardation Neg Hx    Miscarriages / Stillbirths Neg Hx    Vision loss Neg Hx    Varicose Veins Neg Hx      Social History   Tobacco Use   Smoking status: Former    Passive exposure: Current (husband is smoker but he smoked outside the home)   Smokeless tobacco: Never   Tobacco comments:    quit when she was 32 years old  Vaping Use   Vaping Use: Never used  Substance Use Topics   Alcohol use: Not Currently    Comment: social drinker - last ETOH prior to pregnancy   Drug use: Never    Allergies as of 01/02/2023   (No Known Allergies)    Review of Systems:    All systems reviewed and negative except where noted in HPI.   Physical Exam:  LMP 11/29/2022 (Exact Date)  Patient's last menstrual period was 11/29/2022 (exact date). Psych:  Alert and cooperative. Normal mood and affect. General:   Alert,  Well-developed, well-nourished, pleasant and cooperative in NAD Head:  Normocephalic and atraumatic. Eyes:  Sclera clear, no icterus.   Conjunctiva pink. Neck:  Supple; no masses or thyromegaly. Lungs:  Respirations even and unlabored.  Clear throughout to auscultation.   No wheezes, crackles, or rhonchi. No acute distress. Heart:  Regular rate and rhythm; no murmurs, clicks, rubs, or gallops. Abdomen:  Normal bowel sounds.  No bruits.  Soft, and obese; No masses, hepatosplenomegaly or hernias noted.  Moderate generalized tenderness; worse in the bilateral lower abdomen.  No guarding or rebound tenderness.    Neurologic:  Alert and oriented x3;  grossly normal neurologically. Psych:  Alert and cooperative. Normal mood and affect.  Imaging Studies: CT Renal Stone Study  Result Date: 12/20/2022 CLINICAL DATA:  Abdominal and flank pain.  Nephrolithiasis. EXAM: CT ABDOMEN AND PELVIS WITHOUT CONTRAST TECHNIQUE: Multidetector CT imaging of the  abdomen and pelvis was performed following the standard protocol without IV contrast. RADIATION DOSE REDUCTION: This exam was performed according to the departmental dose-optimization program which includes automated exposure control, adjustment of the mA and/or kV according to patient size and/or use of iterative reconstruction technique. COMPARISON:  02/20/2016 FINDINGS: Lower chest: No acute findings. Hepatobiliary: No mass visualized on this unenhanced exam. Gallbladder is unremarkable. No evidence of biliary ductal dilatation. Pancreas: No mass or inflammatory process visualized on this unenhanced exam. Spleen:  Within normal limits in size. Adrenals/Urinary tract: 2 mm calculus in midpole of right kidney. No evidence of ureteral calculi or hydronephrosis. Unremarkable unopacified urinary bladder. Stomach/Bowel:  No evidence of obstruction, inflammatory process, or abnormal fluid collections. Vascular/Lymphatic: No pathologically enlarged lymph nodes identified. No evidence of abdominal aortic aneurysm. Reproductive: IUD in appropriate position. No mass or other significant abnormality. Other:  None. Musculoskeletal:  No suspicious bone lesions identified. IMPRESSION: 2 mm right renal calculus. No evidence of ureteral calculi, hydronephrosis, or other acute findings. Electronically Signed   By: Danae Orleans M.D.   On: 12/20/2022 09:52    Assessment and Plan:   Alexandra Fernandez is a 32 y.o. y/o female has been referred for   Abdominal Pain, Generalized upper and lower; most likely due to IBS and constipation  Continue dicyclomine 10mg  TID as needed abdominal cramping.  Chronic Constipation - Lifelong  Continue MiraLAX 1 capful twice daily and Colace stool softener 100 mg once daily.  Recommend high-fiber diet and 64 ounces of water daily.  Anemia (Hx menorrhagia); rule out GI source for anemia.  Labs: CBC, iron panel, B12, folate  Scheduling EGD & Colonoscopy I discussed risks of EGD and  colonoscopy with patient to include risk of bleeding, perforation, and risk of sedation.  Patient expressed understanding and agrees to proceed with procedures.   4.  Rectal bleeding.  Mild occasional episode attributed to constipation.  Scheduling colonoscopy.  5.  IBS, Constipation predominant  Continue dicyclomine 10 Mg 3 times daily as needed abdominal cramping.  Follow up in 4 weeks after EGD and colonoscopy with TG.  Celso Amy, PA-C

## 2023-01-02 ENCOUNTER — Other Ambulatory Visit: Payer: Self-pay

## 2023-01-02 ENCOUNTER — Ambulatory Visit (INDEPENDENT_AMBULATORY_CARE_PROVIDER_SITE_OTHER): Payer: Medicaid Other | Admitting: Physician Assistant

## 2023-01-02 ENCOUNTER — Encounter: Payer: Self-pay | Admitting: Physician Assistant

## 2023-01-02 VITALS — BP 105/66 | HR 76 | Temp 98.4°F | Ht 65.0 in | Wt 183.0 lb

## 2023-01-02 DIAGNOSIS — R1084 Generalized abdominal pain: Secondary | ICD-10-CM

## 2023-01-02 DIAGNOSIS — D649 Anemia, unspecified: Secondary | ICD-10-CM | POA: Diagnosis not present

## 2023-01-02 DIAGNOSIS — K5904 Chronic idiopathic constipation: Secondary | ICD-10-CM

## 2023-01-02 NOTE — Patient Instructions (Signed)

## 2023-01-03 ENCOUNTER — Telehealth: Payer: Self-pay

## 2023-01-03 LAB — IRON,TIBC AND FERRITIN PANEL
Ferritin: 29 ng/mL (ref 15–150)
Iron Saturation: 18 % (ref 15–55)
Iron: 59 ug/dL (ref 27–159)
Total Iron Binding Capacity: 337 ug/dL (ref 250–450)
UIBC: 278 ug/dL (ref 131–425)

## 2023-01-03 LAB — CBC
Hematocrit: 37.3 % (ref 34.0–46.6)
Hemoglobin: 12.1 g/dL (ref 11.1–15.9)
MCH: 29.4 pg (ref 26.6–33.0)
MCHC: 32.4 g/dL (ref 31.5–35.7)
MCV: 91 fL (ref 79–97)
Platelets: 320 10*3/uL (ref 150–450)
RBC: 4.11 x10E6/uL (ref 3.77–5.28)
RDW: 14 % (ref 11.7–15.4)
WBC: 5.2 10*3/uL (ref 3.4–10.8)

## 2023-01-03 LAB — B12 AND FOLATE PANEL
Folate: 17.7 ng/mL (ref >3.0)
Vitamin B-12: 982 pg/mL (ref 232–1245)

## 2023-01-03 NOTE — Progress Notes (Signed)
Notify patient CBC, hemoglobin, iron panel, vitamin B12, and folate are all normal.  No evidence of anemia, iron deficiency, or B12 deficiency at this time.  Continue with current plan.

## 2023-01-03 NOTE — Telephone Encounter (Signed)
Patient notified.   Notify patient CBC, hemoglobin, iron panel, vitamin B12, and folate are all normal.  No evidence of anemia, iron deficiency, or B12 deficiency at this time.  Continue with current plan.

## 2023-01-06 ENCOUNTER — Encounter: Payer: Self-pay | Admitting: Obstetrics

## 2023-01-10 ENCOUNTER — Other Ambulatory Visit: Payer: Self-pay | Admitting: Nurse Practitioner

## 2023-01-10 DIAGNOSIS — N644 Mastodynia: Secondary | ICD-10-CM

## 2023-01-15 ENCOUNTER — Ambulatory Visit
Admission: RE | Admit: 2023-01-15 | Discharge: 2023-01-15 | Disposition: A | Discharge status: Home / Self Care | Payer: Medicaid Other | Source: Ambulatory Visit | Attending: Nurse Practitioner | Admitting: Nurse Practitioner

## 2023-01-15 DIAGNOSIS — N644 Mastodynia: Secondary | ICD-10-CM | POA: Diagnosis not present

## 2023-01-17 ENCOUNTER — Telehealth: Payer: Self-pay

## 2023-01-17 NOTE — Telephone Encounter (Signed)
Patient called requesting refills on her colace and miralax. However, I let her know that she is able to get her medications at her local pharmacy without prescriptions. Patient stated that she will get them. Patient had no further questions.

## 2023-02-17 ENCOUNTER — Telehealth: Payer: Self-pay | Admitting: Gastroenterology

## 2023-02-17 ENCOUNTER — Telehealth: Payer: Self-pay

## 2023-02-17 NOTE — Telephone Encounter (Signed)
Patient lvm requesting to reschedule her 02/19/23 colonoscopy with Dr. Tobi Bastos due to no transportation.  She also requested her prep to go to a different pharmacy.  Please call patient back.  Thanks,  [place], [place]

## 2023-02-17 NOTE — Telephone Encounter (Signed)
Patient called in to reschedule her colonoscopy to a Friday. You can call her anytime, she want to change her pharmacy to [facility] on [address].

## 2023-02-18 ENCOUNTER — Other Ambulatory Visit: Payer: Self-pay

## 2023-02-18 DIAGNOSIS — K5904 Chronic idiopathic constipation: Secondary | ICD-10-CM

## 2023-02-18 DIAGNOSIS — D649 Anemia, unspecified: Secondary | ICD-10-CM

## 2023-02-18 DIAGNOSIS — R1084 Generalized abdominal pain: Secondary | ICD-10-CM

## 2023-02-18 DIAGNOSIS — Z1211 Encounter for screening for malignant neoplasm of colon: Secondary | ICD-10-CM

## 2023-02-18 MED ORDER — NA SULFATE-K SULFATE-MG SULF 17.5-3.13-1.6 GM/177ML PO SOLN: 354.0000 mL | Freq: Once | ORAL | 0 refills | Status: AC

## 2023-02-18 NOTE — Telephone Encounter (Signed)
Called patient to reschedule her procedure and she stated that she would like to reschedule for a Monday or Friday. I then gave her some dates and she stated that she would want a female doctor. So, I then gave her Dr. Verdis Prime availabilities and she stated that seh would call us back to let us know what date she would choose. I gave her my personal phone number to call me back and let me know,

## 2023-02-18 NOTE — Telephone Encounter (Signed)
Patient called back and she decided on having her procedures with Dr. Allegra Lai as she requested a female to do her procedures. I told her that it was fine and that I will change it for her. I also reminded her to change the dates on her paperwork that were provided and she stated that she would. Her follow up with Mrs. Inetta Fermo were rescheduled as well.

## 2023-02-18 NOTE — Addendum Note (Signed)
Addended by: Adela Ports on: 02/18/2023 12:01 PM   Modules accepted: Orders

## 2023-03-26 ENCOUNTER — Ambulatory Visit: Payer: Medicaid Other | Admitting: Physician Assistant

## 2023-04-04 ENCOUNTER — Telehealth: Payer: Self-pay

## 2023-04-04 ENCOUNTER — Ambulatory Visit: Admit: 2023-04-04 | Payer: Medicaid Other | Admitting: Gastroenterology

## 2023-04-04 ENCOUNTER — Encounter: Payer: Self-pay | Admitting: Gastroenterology

## 2023-04-04 ENCOUNTER — Ambulatory Visit
Admission: RE | Admit: 2023-04-04 | Discharge: 2023-04-04 | Disposition: A | Discharge status: Home / Self Care | Payer: Medicaid Other | Attending: Gastroenterology | Admitting: Gastroenterology

## 2023-04-04 ENCOUNTER — Ambulatory Visit: Payer: Medicaid Other | Admitting: Certified Registered Nurse Anesthetist

## 2023-04-04 ENCOUNTER — Encounter
Admission: RE | Disposition: A | Discharge status: Home / Self Care | Payer: Self-pay | Source: Home / Self Care | Attending: Gastroenterology

## 2023-04-04 DIAGNOSIS — K625 Hemorrhage of anus and rectum: Secondary | ICD-10-CM

## 2023-04-04 DIAGNOSIS — D500 Iron deficiency anemia secondary to blood loss (chronic): Secondary | ICD-10-CM | POA: Diagnosis present

## 2023-04-04 DIAGNOSIS — K5904 Chronic idiopathic constipation: Secondary | ICD-10-CM

## 2023-04-04 DIAGNOSIS — D649 Anemia, unspecified: Secondary | ICD-10-CM

## 2023-04-04 DIAGNOSIS — B9681 Helicobacter pylori [H. pylori] as the cause of diseases classified elsewhere: Secondary | ICD-10-CM | POA: Diagnosis not present

## 2023-04-04 DIAGNOSIS — K2950 Unspecified chronic gastritis without bleeding: Secondary | ICD-10-CM | POA: Diagnosis not present

## 2023-04-04 DIAGNOSIS — R1013 Epigastric pain: Secondary | ICD-10-CM

## 2023-04-04 DIAGNOSIS — Z87891 Personal history of nicotine dependence: Secondary | ICD-10-CM | POA: Diagnosis not present

## 2023-04-04 DIAGNOSIS — R1084 Generalized abdominal pain: Secondary | ICD-10-CM

## 2023-04-04 HISTORY — PX: ESOPHAGOGASTRODUODENOSCOPY (EGD) WITH PROPOFOL: SHX5813

## 2023-04-04 HISTORY — PX: COLONOSCOPY WITH PROPOFOL: SHX5780

## 2023-04-04 HISTORY — PX: BIOPSY: SHX5522

## 2023-04-04 LAB — POCT PREGNANCY, URINE: Preg Test, Ur: NEGATIVE

## 2023-04-04 SURGERY — COLONOSCOPY WITH PROPOFOL
Anesthesia: General

## 2023-04-04 MED ORDER — PROPOFOL 10 MG/ML IV BOLUS
INTRAVENOUS | Status: DC | PRN
  Administered 2023-04-04: 50 mg via INTRAVENOUS

## 2023-04-04 MED ORDER — LIDOCAINE HCL (CARDIAC) PF 100 MG/5ML IV SOSY
PREFILLED_SYRINGE | INTRAVENOUS | Status: DC | PRN
  Administered 2023-04-04: 100 mg via INTRAVENOUS

## 2023-04-04 MED ORDER — SODIUM CHLORIDE 0.9 % IV SOLN
INTRAVENOUS | Status: DC
  Administered 2023-04-04: 1000 mL via INTRAVENOUS

## 2023-04-04 MED ORDER — FUSION PLUS PO CAPS: 1.0000 | ORAL_CAPSULE | ORAL | 1 refills | Status: DC

## 2023-04-04 MED ORDER — PROPOFOL 500 MG/50ML IV EMUL
INTRAVENOUS | Status: DC | PRN
  Administered 2023-04-04: 170 ug/kg/min via INTRAVENOUS

## 2023-04-04 NOTE — Anesthesia Procedure Notes (Signed)
Date/Time: 04/04/2023 9:39 AM  Performed by: Malva Cogan, CRNAPre-anesthesia Checklist: Patient identified, Emergency Drugs available, Suction available, Patient being monitored and Timeout performed Patient Re-evaluated:Patient Re-evaluated prior to induction Oxygen Delivery Method: Nasal cannula Induction Type: IV induction Placement Confirmation: CO2 detector and positive ETCO2

## 2023-04-04 NOTE — Anesthesia Preprocedure Evaluation (Signed)
Anesthesia Evaluation  Patient identified by MRN, date of birth, ID band Patient awake    Reviewed: Allergy & Precautions, H&P , NPO status , Patient's Chart, lab work & pertinent test results  History of Anesthesia Complications Negative for: history of anesthetic complications  Airway Mallampati: II  TM Distance: >3 FB Neck ROM: full    Dental  (+) Poor Dentition, Chipped, Dental Advidsory Given   Pulmonary neg pulmonary ROS, neg shortness of breath, former smoker   Pulmonary exam normal breath sounds clear to auscultation       Cardiovascular Exercise Tolerance: Good (-) angina (-) Past MI and (-) DOE negative cardio ROS Normal cardiovascular exam(-) dysrhythmias  Rhythm:regular Rate:Normal     Neuro/Psych  PSYCHIATRIC DISORDERS Anxiety Depression    negative neurological ROS     GI/Hepatic Neg liver ROS,GERD  ,,  Endo/Other  negative endocrine ROS    Renal/GU negative Renal ROS  negative genitourinary   Musculoskeletal   Abdominal   Peds  Hematology negative hematology ROS (+)   Anesthesia Other Findings History reviewed. No pertinent past medical history.  History reviewed. No pertinent surgical history.  BMI    Body Mass Index   28.70 kg/m 2      Reproductive/Obstetrics negative OB ROS                             Anesthesia Physical Anesthesia Plan  ASA: 2  Anesthesia Plan: General   Post-op Pain Management:    Induction: Intravenous  PONV Risk Score and Plan: 3 and Propofol infusion and TIVA  Airway Management Planned: Natural Airway and Nasal Cannula  Additional Equipment:   Intra-op Plan:   Post-operative Plan:   Informed Consent: I have reviewed the patients History and Physical, chart, labs and discussed the procedure including the risks, benefits and alternatives for the proposed anesthesia with the patient or authorized representative who has indicated  his/her understanding and acceptance.     Dental Advisory Given  Plan Discussed with: Anesthesiologist, CRNA and Surgeon  Anesthesia Plan Comments:         Anesthesia Quick Evaluation

## 2023-04-04 NOTE — Transfer of Care (Signed)
Immediate Anesthesia Transfer of Care Note  Patient: Alexandra Fernandez  Procedure(s) Performed: COLONOSCOPY WITH PROPOFOL ESOPHAGOGASTRODUODENOSCOPY (EGD) WITH PROPOFOL BIOPSY  Patient Location: PACU  Anesthesia Type:General  Level of Consciousness: drowsy  Airway & Oxygen Therapy: Patient Spontanous Breathing  Post-op Assessment: Report given to RN and Post -op Vital signs reviewed and stable  Post vital signs: Reviewed and stable  Last Vitals:  Vitals Value Taken Time  BP 91/55 04/04/23 1003  Temp    Pulse 79 04/04/23 1003  Resp 16 04/04/23 1003  SpO2 100 % 04/04/23 1003    Last Pain:  Vitals:   04/04/23 1003  TempSrc:   PainSc: Asleep         Complications: No notable events documented.

## 2023-04-04 NOTE — Telephone Encounter (Signed)
-----   Message from [facility] sent at 04/04/2023 10:17 AM EDT ----- Regarding: fusion plus Please send in prescription for fusion plus every other day for 3 months  RV

## 2023-04-04 NOTE — Op Note (Signed)
[facility] Gastroenterology Patient Name: Alexandra Fernandez Procedure Date: 04/04/2023 9:31 AM MRN: [id number] Account #: [id number] Date of Birth: February 01, 1991 Admit Type: Outpatient Age: 32 Room: [facility] ENDO ROOM 2 Gender: Female Note Status: Finalized Instrument Name: Colonscope [id number] Procedure:             Colonoscopy Indications:           This is the patient's first colonoscopy, Rectal                         bleeding Providers:             Toney Reil MD, MD Referring MD:          No Local Md, MD (Referring MD) Medicines:             General Anesthesia Complications:         No immediate complications. Estimated blood loss: None. Procedure:             Pre-Anesthesia Assessment:                        - Prior to the procedure, a History and Physical was                         performed, and patient medications and allergies were                         reviewed. The patient is competent. The risks and                         benefits of the procedure and the sedation options and                         risks were discussed with the patient. All questions                         were answered and informed consent was obtained.                         Patient identification and proposed procedure were                         verified by the physician, the nurse, the                         anesthesiologist, the anesthetist and the technician                         in the pre-procedure area in the procedure room in the                         endoscopy suite. Mental Status Examination: alert and                         oriented. Airway Examination: normal oropharyngeal                         airway and neck mobility. Respiratory Examination:  clear to auscultation. CV Examination: normal.                         Prophylactic Antibiotics: The patient does not require                         prophylactic antibiotics. Prior  Anticoagulants: The                         patient has taken no anticoagulant or antiplatelet                         agents. ASA Grade Assessment: II - A patient with mild                         systemic disease. After reviewing the risks and                         benefits, the patient was deemed in satisfactory                         condition to undergo the procedure. The anesthesia                         plan was to use general anesthesia. Immediately prior                         to administration of medications, the patient was                         re-assessed for adequacy to receive sedatives. The                         heart rate, respiratory rate, oxygen saturations,                         blood pressure, adequacy of pulmonary ventilation, and                         response to care were monitored throughout the                         procedure. The physical status of the patient was                         re-assessed after the procedure.                        After obtaining informed consent, the colonoscope was                         passed under direct vision. Throughout the procedure,                         the patient's blood pressure, pulse, and oxygen                         saturations were monitored continuously. The  Colonoscope was introduced through the anus and                         advanced to the 10 cm into the ileum. The colonoscopy                         was performed without difficulty. The patient                         tolerated the procedure well. The quality of the bowel                         preparation was evaluated using the BBPS [facility] Bowel                         Preparation Scale) with scores of: Right Colon = 3,                         Transverse Colon = 3 and Left Colon = 3 (entire mucosa                         seen well with no residual staining, small fragments                         of stool or  opaque liquid). The total BBPS score                         equals 9. The terminal ileum, ileocecal valve,                         appendiceal orifice, and rectum were photographed. Findings:      The perianal and digital rectal examinations were normal. Pertinent       negatives include normal sphincter tone and no palpable rectal lesions.      The terminal ileum appeared normal.      The entire examined colon appeared normal.      The retroflexed view of the distal rectum and anal verge was normal and       showed no anal or rectal abnormalities. Impression:            - The examined portion of the ileum was normal.                        - The entire examined colon is normal.                        - The distal rectum and anal verge are normal on                         retroflexion view.                        - No specimens collected. Recommendation:        - Discharge patient to home (with escort).                        - Resume previous diet today.                        -  Continue present medications. Procedure Code(s):     --- Professional ---                        [phone], Colonoscopy, flexible; diagnostic, including                         collection of specimen(s) by brushing or washing, when                         performed (separate procedure) Diagnosis Code(s):     --- Professional ---                        K62.5, Hemorrhage of anus and rectum CPT copyright 2022 American Medical Association. All rights reserved. The codes documented in this report are preliminary and upon coder review may  be revised to meet current compliance requirements. Dr. Libby Maw Toney Reil MD, MD 04/04/2023 10:00:51 AM This report has been signed electronically. Number of Addenda: 0 Note Initiated On: 04/04/2023 9:31 AM Scope Withdrawal Time: 0 hours 3 minutes 32 seconds  Total Procedure Duration: 0 hours 6 minutes 52 seconds  Estimated Blood Loss:  Estimated blood loss: none.       [facility]

## 2023-04-04 NOTE — Op Note (Signed)
[facility] Gastroenterology Patient Name: Alexandra Fernandez Procedure Date: 04/04/2023 9:32 AM MRN: [id number] Account #: [id number] Date of Birth: 1990-09-17 Admit Type: Outpatient Age: 32 Room: [facility] ENDO ROOM 2 Gender: Female Note Status: Finalized Instrument Name: Laurette Schimke [id number] Procedure:             Upper GI endoscopy Indications:           Iron deficiency anemia secondary to chronic blood                         loss, Dyspepsia Providers:             Toney Reil MD, MD Referring MD:          No Local Md, MD (Referring MD) Medicines:             General Anesthesia Complications:         No immediate complications. Estimated blood loss: None. Procedure:             Pre-Anesthesia Assessment:                        - Prior to the procedure, a History and Physical was                         performed, and patient medications and allergies were                         reviewed. The patient is competent. The risks and                         benefits of the procedure and the sedation options and                         risks were discussed with the patient. All questions                         were answered and informed consent was obtained.                         Patient identification and proposed procedure were                         verified by the physician, the nurse, the                         anesthesiologist, the anesthetist and the technician                         in the pre-procedure area in the procedure room in the                         endoscopy suite. Mental Status Examination: alert and                         oriented. Airway Examination: normal oropharyngeal                         airway and neck mobility. Respiratory Examination:  clear to auscultation. CV Examination: normal.                         Prophylactic Antibiotics: The patient does not require                         prophylactic  antibiotics. Prior Anticoagulants: The                         patient has taken no anticoagulant or antiplatelet                         agents. ASA Grade Assessment: II - A patient with mild                         systemic disease. After reviewing the risks and                         benefits, the patient was deemed in satisfactory                         condition to undergo the procedure. The anesthesia                         plan was to use general anesthesia. Immediately prior                         to administration of medications, the patient was                         re-assessed for adequacy to receive sedatives. The                         heart rate, respiratory rate, oxygen saturations,                         blood pressure, adequacy of pulmonary ventilation, and                         response to care were monitored throughout the                         procedure. The physical status of the patient was                         re-assessed after the procedure.                        After obtaining informed consent, the endoscope was                         passed under direct vision. Throughout the procedure,                         the patient's blood pressure, pulse, and oxygen                         saturations were monitored continuously. The Endoscope  was introduced through the mouth, and advanced to the                         second part of duodenum. The upper GI endoscopy was                         accomplished without difficulty. The patient tolerated                         the procedure well. Findings:      The examined duodenum was normal. Biopsies for histology were taken with       a cold forceps for evaluation of celiac disease.      Diffuse nodular mucosa was found in the gastric antrum and in the       prepyloric region of the stomach. Biopsies were taken with a cold       forceps for Helicobacter pylori testing.      The  gastric body was normal. Biopsies were taken with a cold forceps for       Helicobacter pylori testing.      The cardia and gastric fundus were normal on retroflexion.      The Z-line was irregular and was found 35 cm from the incisors.      The examined esophagus was normal. Impression:            - Normal examined duodenum. Biopsied.                        - Nodular mucosa in the gastric antrum and in the                         prepyloric region of the stomach. Biopsied.                        - Normal gastric body. Biopsied.                        - Z-line irregular, 35 cm from the incisors.                        - Normal esophagus. Recommendation:        - Await pathology results.                        - Proceed with colonoscopy as scheduled                        See colonoscopy report Procedure Code(s):     --- Professional ---                        [phone], Esophagogastroduodenoscopy, flexible,                         transoral; with biopsy, single or multiple Diagnosis Code(s):     --- Professional ---                        K31.89, Other diseases of stomach and duodenum  K22.89, Other specified disease of esophagus                        D50.0, Iron deficiency anemia secondary to blood loss                         (chronic)                        R10.13, Epigastric pain CPT copyright 2022 American Medical Association. All rights reserved. The codes documented in this report are preliminary and upon coder review may  be revised to meet current compliance requirements. Dr. Libby Maw Toney Reil MD, MD 04/04/2023 9:50:27 AM This report has been signed electronically. Number of Addenda: 0 Note Initiated On: 04/04/2023 9:32 AM Estimated Blood Loss:  Estimated blood loss: none.      [facility]

## 2023-04-04 NOTE — Telephone Encounter (Signed)
Sent medication to the pharmacy  

## 2023-04-04 NOTE — H&P (Signed)
Alexandra Repress, MD [address]  [address]  [place], [place] [address]  Main: [phone]  Fax: [phone] Pager: [phone]  Primary Care Physician:  [facility] Primary Gastroenterologist:  Dr. Arlyss Fernandez  Pre-Procedure History & Physical: HPI:  Alexandra Fernandez is a 32 y.o. female is here for an endoscopy and colonoscopy.   Past Medical History:  Diagnosis Date   Abnormal glucose affecting pregnancy 08/14/2017   Acid reflux    Anxiety and depression    Broken arm    AGe 49; broke left armc when thrown from horse   Colitis    Hyperthyroidism 04/06/2021   Preeclampsia, unspecified trimester 10/14/2017   Uterine fibroid 10/28/2017    Past Surgical History:  Procedure Laterality Date   APPENDECTOMY     CESAREAN SECTION     LAPAROSCOPIC APPENDECTOMY N/A 02/20/2016   Procedure: APPENDECTOMY LAPAROSCOPIC;  Surgeon: Lattie Haw, MD;  Location: [facility] ORS;  Service: General;  Laterality: N/A;    Prior to Admission medications   Medication Sig Start Date End Date Taking? Authorizing Provider  dicyclomine (BENTYL) 10 MG capsule Take 1 capsule (10 mg total) by mouth 3 (three) times daily as needed for spasms. 12/20/22   Minna Antis, MD  docusate sodium (COLACE) 100 MG capsule Take 1 capsule (100 mg total) by mouth daily. 12/20/22 12/20/23  Minna Antis, MD  methimazole (TAPAZOLE) 5 MG tablet Take 5 mg by mouth 2 (two) times daily.    [provider]  polyethylene glycol powder (GLYCOLAX/MIRALAX) 17 GM/SCOOP powder Take 17 g by mouth daily. 12/20/22   Minna Antis, MD    Allergies as of 01/02/2023   (No Known Allergies)    Family History  Problem Relation Age of Onset   Asthma Mother    Anemia Mother    Fibroids Mother    Diabetes Father        prediabetic   Kidney Stones Sister    Multiple births Maternal Uncle    Cancer Maternal Grandmother        lung   Stroke Paternal Grandmother    Heart disease  Paternal Grandmother        had pacemaker   Alcohol abuse Neg Hx    Arthritis Neg Hx    Birth defects Neg Hx    COPD Neg Hx    Depression Neg Hx    Drug abuse Neg Hx    Early death Neg Hx    Hearing loss Neg Hx    Hyperlipidemia Neg Hx    Hypertension Neg Hx    Kidney disease Neg Hx    Learning disabilities Neg Hx    Mental illness Neg Hx    Mental retardation Neg Hx    Miscarriages / Stillbirths Neg Hx    Vision loss Neg Hx    Varicose Veins Neg Hx     Social History   Socioeconomic History   Marital status: Married    Spouse name: Alexandra Fernandez   Number of children: 1   Years of education: 12th   Highest education level: GED or equivalent  Occupational History    Employer: [employer]    Comment: Full time  Tobacco Use   Smoking status: Former    Passive exposure: Current (husband is smoker but he smoked outside the home)   Smokeless tobacco: Never   Tobacco comments:    quit when she was 32 years old  Vaping Use   Vaping status: Never Used  Substance and  Sexual Activity   Alcohol use: Not Currently    Comment: social drinker - last ETOH prior to pregnancy   Drug use: Never   Sexual activity: Yes    Birth control/protection: None, Implant    Comment: Removed 04/2020  Other Topics Concern   Not on file  Social History Narrative   Lives at home at husband and son. Husband is supportive with pregnancy. 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    Social Determinants of Health   Financial Resource Strain: Low Risk  (02/01/2021)   Overall Financial Resource Strain (CARDIA)    Difficulty of Paying Living Expenses: Not very hard  Food Insecurity: No Food Insecurity (02/01/2021)   Hunger Vital Sign    Worried About Running Out of Food in the Last Year: Never true    Ran Out of Food in the Last Year: Never true  Transportation Needs: No Transportation Needs (02/01/2021)   PRAPARE - Administrator, Civil Service (Medical): No    Lack of Transportation (Non-Medical):  No  Physical Activity: Not on file  Stress: Not on file  Social Connections: Not on file  Intimate Partner Violence: Not At Risk (02/01/2021)   Humiliation, Afraid, Rape, and Kick questionnaire    Fear of Current or Ex-Partner: No    Emotionally Abused: No    Physically Abused: No    Sexually Abused: No    Review of Systems: See HPI, otherwise negative ROS  Physical Exam: BP 108/73   Pulse 76   Temp (!) 97.2 F (36.2 C) (Temporal)   Resp 18   Ht 5\' 5"  (1.651 m)   Wt 79.4 kg   LMP 03/20/2023 Comment: pregnancy test negative  SpO2 100%   BMI 29.11 kg/m  General:   Alert,  pleasant and cooperative in NAD Head:  Normocephalic and atraumatic. Neck:  Supple; no masses or thyromegaly. Lungs:  Clear throughout to auscultation.    Heart:  Regular rate and rhythm. Abdomen:  Soft, nontender and nondistended. Normal bowel sounds, without guarding, and without rebound.   Neurologic:  Alert and  oriented x4;  grossly normal neurologically.  Impression/Plan: Alexandra Fernandez is here for an endoscopy and colonoscopy to be performed for IDA, rectal bleeding, dyspepsia  Risks, benefits, limitations, and alternatives regarding  endoscopy and colonoscopy have been reviewed with the patient.  Questions have been answered.  All parties agreeable.   Alexandra Donath, MD  04/04/2023, 9:26 AM

## 2023-04-08 ENCOUNTER — Telehealth: Payer: Self-pay

## 2023-04-08 ENCOUNTER — Encounter: Payer: Self-pay | Admitting: Gastroenterology

## 2023-04-08 MED ORDER — CLARITHROMYCIN 500 MG PO TABS: 500.0000 mg | ORAL_TABLET | Freq: Two times a day (BID) | ORAL | 0 refills | Status: AC

## 2023-04-08 MED ORDER — OMEPRAZOLE 20 MG PO CPDR: 20.0000 mg | DELAYED_RELEASE_CAPSULE | Freq: Two times a day (BID) | ORAL | 0 refills | Status: AC

## 2023-04-08 MED ORDER — AMOXICILLIN 500 MG PO TABS: 1000.0000 mg | ORAL_TABLET | Freq: Two times a day (BID) | ORAL | 0 refills | Status: AC

## 2023-04-08 NOTE — Telephone Encounter (Signed)
Patient verbalized understanding of results and sent medication to pharmacy. Put a reminder for 6 weeks for lab test

## 2023-04-08 NOTE — Telephone Encounter (Signed)
-----   Message from [facility] sent at 04/08/2023  3:32 PM EDT ----- Please inform patient that the pathology results from upper endoscopy confirm H. pylori gastritis Recommend treatment for it, please send in the prescription for triple therapy to treat H Pylori for 14days  Omeprazole 20mg  BID Clarithromycin 500mg  BID Amoxicillin 1gm BID  Order H Pylori breath test in 4weeks after completing medication to confirm eradication.  Patient should be off prilosec and H2 blocker atleast for 2weeks before the test  Thanks RV

## 2023-04-08 NOTE — Telephone Encounter (Signed)
Called and left a message for call back  

## 2023-04-10 ENCOUNTER — Other Ambulatory Visit: Payer: Self-pay

## 2023-04-10 NOTE — Anesthesia Postprocedure Evaluation (Signed)
Anesthesia Post Note  Patient: Alexandra Fernandez  Procedure(s) Performed: COLONOSCOPY WITH PROPOFOL ESOPHAGOGASTRODUODENOSCOPY (EGD) WITH PROPOFOL BIOPSY  Patient location during evaluation: Endoscopy Anesthesia Type: General Level of consciousness: awake and alert Pain management: pain level controlled Vital Signs Assessment: post-procedure vital signs reviewed and stable Respiratory status: spontaneous breathing, nonlabored ventilation, respiratory function stable and patient connected to nasal cannula oxygen Cardiovascular status: blood pressure returned to baseline and stable Postop Assessment: no apparent nausea or vomiting Anesthetic complications: no   No notable events documented.   Last Vitals:  Vitals:   04/04/23 1013 04/04/23 1023  BP: 97/60 106/65  Pulse: 77 84  Resp: 15 18  Temp:    SpO2: 100% 100%    Last Pain:  Vitals:   04/04/23 1023  TempSrc:   PainSc: 0-No pain                 Lenard Simmer

## 2023-04-13 NOTE — Progress Notes (Unsigned)
Celso Amy, PA-C [address]  [address]  [place], [place] [address]  Main: [phone]  Fax: [phone]   Primary Care Physician: [facility]  Primary Gastroenterologist:  Celso Amy, PA-C / Dr. Lannette Donath    CC: F/U abdominal pain, constipation, anemia, rectal bleeding, IBS  HPI: Alexandra Fernandez is a 32 y.o. female returns for 63-month follow-up of generalized abdominal pain, IBS with constipation, anemia, and rectal bleeding.  Colonoscopy 04/04/2023 by Dr. Allegra Lai was normal.  No biopsies.  EGD 04/04/2023 by Dr. Allegra Lai showed nodular mucosa in the gastric antrum, otherwise normal esophagus and duodenum.  Biopsies were positive for H. pylori and negative for celiac.    She started taking treatment for H. pylori 04/08/2023: amoxicillin 1G twice daily, clarithromycin 500 mg twice daily, and omeprazole 20 Mg twice daily.  On this treatment she is feeling a lot better.  She has no more upper abdominal pain or acid reflux.  Constipation is controlled on stool softener and OTC MiraLAX as needed.  She has no GI symptoms or concerns today.  No recent episodes of rectal bleeding.    Labs 01/02/23 showed normal CBC, hemoglobin 12.1g, normal iron panel, B12, and folate.  No evidence of anemia.  She continues taking fusion plus iron 1 tablet every other day (3 days/week).  She needs a refill for the prescription fusion iron.  Current Outpatient Medications  Medication Sig Dispense Refill   amoxicillin (AMOXIL) 500 MG tablet Take 2 tablets (1,000 mg total) by mouth 2 (two) times daily for 14 days. 56 tablet 0   chlorhexidine (PERIDEX) 0.12 % solution 15 mLs 2 (two) times daily.     Cholecalciferol (VITAMIN D3) 10 MCG (400 UNIT) tablet Take 800 Units by mouth daily.     clarithromycin (BIAXIN) 500 MG tablet Take 1 tablet (500 mg total) by mouth 2 (two) times daily for 14 days. 28 tablet 0   docusate sodium (COLACE) 100 MG capsule Take 1 capsule (100 mg total) by  mouth daily. 60 capsule 2   omeprazole (PRILOSEC) 20 MG capsule Take 1 capsule (20 mg total) by mouth 2 (two) times daily before a meal for 14 days. 28 capsule 0   Iron-FA-B Cmp-C-Biot-Probiotic (FUSION PLUS) CAPS Take 1 capsule by mouth every other day. 30 capsule 1   No current facility-administered medications for this visit.    Allergies as of 04/14/2023   (No Known Allergies)    Past Medical History:  Diagnosis Date   Abnormal glucose affecting pregnancy 08/14/2017   Acid reflux    Anxiety and depression    Broken arm    AGe 33; broke left armc when thrown from horse   Colitis    Hyperthyroidism 04/06/2021   Preeclampsia, unspecified trimester 10/14/2017   Uterine fibroid 10/28/2017    Past Surgical History:  Procedure Laterality Date   APPENDECTOMY     BIOPSY  04/04/2023   Procedure: BIOPSY;  Surgeon: Toney Reil, MD;  Location: [facility] ENDOSCOPY;  Service: Gastroenterology;;   CESAREAN SECTION     COLONOSCOPY WITH PROPOFOL N/A 04/04/2023   Procedure: COLONOSCOPY WITH PROPOFOL;  Surgeon: Toney Reil, MD;  Location: [facility] ENDOSCOPY;  Service: Gastroenterology;  Laterality: N/A;   ESOPHAGOGASTRODUODENOSCOPY (EGD) WITH PROPOFOL N/A 04/04/2023   Procedure: ESOPHAGOGASTRODUODENOSCOPY (EGD) WITH PROPOFOL;  Surgeon: Toney Reil, MD;  Location: [facility] ENDOSCOPY;  Service: Gastroenterology;  Laterality: N/A;   LAPAROSCOPIC APPENDECTOMY N/A 02/20/2016   Procedure: APPENDECTOMY LAPAROSCOPIC;  Surgeon: Lattie Haw,  MD;  Location: [facility] ORS;  Service: General;  Laterality: N/A;    Review of Systems:    All systems reviewed and negative except where noted in HPI.   Physical Examination:   BP 111/67   Pulse (!) 55   Temp 98.4 F (36.9 C)   Ht 5\' 5"  (1.651 m)   Wt 176 lb (79.8 kg)   BMI 29.29 kg/m   General: Well-nourished, well-developed in no acute distress.  Neuro: Alert and oriented x 3.  Grossly intact.  Psych: Alert and cooperative, normal mood and  affect. No exam performed.  Imaging Studies: See HPI.  Assessment and Plan:   Alexandra Fernandez is a 32 y.o. y/o female returns for followup of abdominal pain, H. pylori gastritis, constipation, and iron deficiency anemia.  Recent EGD positive for H. pylori gastritis and is currently on treatment with great benefit.  Upper abdominal pain and acid reflux resolved on treatment.  Recent colonoscopy was normal.  Constipation has improved on treatment.  1.  H. pylori gastritis -asymptomatic on treatment with clarithromycin, amoxicillin, PPI  Finish 14-day treatment with clarithromycin, amoxicillin, and omeprazole.  4 weeks after finishing treatment, then complete H. pylori breath test (off PPI 2 weeks prior) to ensure eradication.   2.  GERD -controlled on treatment  After she completes H. pylori breath test, then I recommend she continue omeprazole 40 mg daily  Recommend Lifestyle Modifications to prevent Acid Reflux.  Rec. Avoid coffee, sodas, peppermint, citrus fruits, and spicey foods.  Avoid eating 2-3 hours before bedtime.   3.  Chronic constipation -improved  Continue Colace stool softener and OTC MiraLAX as needed  4.  History of iron deficiency anemia -improved  Refilled fusion plus iron 1 tablet 3 days/week.  Follow-up with PCP to monitor labs in future.  Celso Amy, PA-C  Follow up as needed based on follow-up H. pylori breath test and GI symptoms.

## 2023-04-14 ENCOUNTER — Encounter: Payer: Self-pay | Admitting: Physician Assistant

## 2023-04-14 ENCOUNTER — Ambulatory Visit (INDEPENDENT_AMBULATORY_CARE_PROVIDER_SITE_OTHER): Payer: Medicaid Other | Admitting: Physician Assistant

## 2023-04-14 VITALS — BP 111/67 | HR 55 | Temp 98.4°F | Ht 65.0 in | Wt 176.0 lb

## 2023-04-14 DIAGNOSIS — B9681 Helicobacter pylori [H. pylori] as the cause of diseases classified elsewhere: Secondary | ICD-10-CM | POA: Diagnosis not present

## 2023-04-14 DIAGNOSIS — K219 Gastro-esophageal reflux disease without esophagitis: Secondary | ICD-10-CM

## 2023-04-14 DIAGNOSIS — D500 Iron deficiency anemia secondary to blood loss (chronic): Secondary | ICD-10-CM

## 2023-04-14 DIAGNOSIS — Z862 Personal history of diseases of the blood and blood-forming organs and certain disorders involving the immune mechanism: Secondary | ICD-10-CM | POA: Diagnosis not present

## 2023-04-14 DIAGNOSIS — K5904 Chronic idiopathic constipation: Secondary | ICD-10-CM

## 2023-04-14 DIAGNOSIS — K5909 Other constipation: Secondary | ICD-10-CM | POA: Diagnosis not present

## 2023-04-14 DIAGNOSIS — A048 Other specified bacterial intestinal infections: Secondary | ICD-10-CM

## 2023-04-14 MED ORDER — FUSION PLUS PO CAPS
1.0000 | ORAL_CAPSULE | ORAL | 1 refills | Status: AC
Start: 2023-04-14 — End: ?

## 2023-04-14 NOTE — Patient Instructions (Addendum)
You may come to the office on 05-19-23 between 1-4 pm to retest for the H/Pylori. Bring your lab sheet with you.

## 2023-05-20 ENCOUNTER — Telehealth: Payer: Self-pay

## 2023-05-20 DIAGNOSIS — A048 Other specified bacterial intestinal infections: Secondary | ICD-10-CM

## 2023-05-20 NOTE — Telephone Encounter (Signed)
Informed patient of this information and she verbalized understanding. She will come to the lab soon

## 2023-05-20 NOTE — Telephone Encounter (Signed)
-----   Message from [facility] sent at 04/08/2023  4:57 PM EDT ----- Order H Pylori breath test in 4weeks after completing medication to confirm eradication.  Patient should be off prilosec and H2 blocker atleast for 2weeks before the test

## 2023-05-22 LAB — H. PYLORI BREATH TEST: H pylori Breath Test: NEGATIVE
